# Patient Record
Sex: Male | Born: 1945 | Race: White | Hispanic: No | Marital: Married | Smoking: Former smoker
Health system: Southern US, Community
[De-identification: ages and names within clinical notes are randomized; demographics above are authoritative.]

## PROBLEM LIST (undated history)

## (undated) DIAGNOSIS — I10 Essential (primary) hypertension: Secondary | ICD-10-CM

## (undated) DIAGNOSIS — L570 Actinic keratosis: Secondary | ICD-10-CM

## (undated) DIAGNOSIS — K5790 Diverticulosis of intestine, part unspecified, without perforation or abscess without bleeding: Secondary | ICD-10-CM

## (undated) DIAGNOSIS — E119 Type 2 diabetes mellitus without complications: Secondary | ICD-10-CM

## (undated) DIAGNOSIS — K760 Fatty (change of) liver, not elsewhere classified: Secondary | ICD-10-CM

## (undated) DIAGNOSIS — K862 Cyst of pancreas: Secondary | ICD-10-CM

## (undated) DIAGNOSIS — I700 Atherosclerosis of aorta: Secondary | ICD-10-CM

## (undated) DIAGNOSIS — K8020 Calculus of gallbladder without cholecystitis without obstruction: Secondary | ICD-10-CM

## (undated) HISTORY — PX: NECK SURGERY: SHX720

## (undated) HISTORY — DX: Type 2 diabetes mellitus without complications: E11.9

## (undated) HISTORY — DX: Atherosclerosis of aorta: I70.0

## (undated) HISTORY — DX: Actinic keratosis: L57.0

## (undated) HISTORY — DX: Diverticulosis of intestine, part unspecified, without perforation or abscess without bleeding: K57.90

## (undated) HISTORY — DX: Fatty (change of) liver, not elsewhere classified: K76.0

## (undated) HISTORY — DX: Cyst of pancreas: K86.2

## (undated) HISTORY — DX: Calculus of gallbladder without cholecystitis without obstruction: K80.20

## (undated) HISTORY — PX: APPENDECTOMY: SHX54

## (undated) HISTORY — DX: Essential (primary) hypertension: I10

---

## 2005-10-04 ENCOUNTER — Ambulatory Visit: Payer: Self-pay | Admitting: Chiropractic Medicine

## 2005-10-08 ENCOUNTER — Ambulatory Visit: Payer: Self-pay | Admitting: Internal Medicine

## 2006-03-17 ENCOUNTER — Observation Stay (HOSPITAL_COMMUNITY): Admission: RE | Admit: 2006-03-17 | Discharge: 2006-03-18 | Payer: Self-pay | Admitting: Neurological Surgery

## 2006-07-20 ENCOUNTER — Inpatient Hospital Stay (HOSPITAL_COMMUNITY): Admission: EM | Admit: 2006-07-20 | Discharge: 2006-07-26 | Payer: Self-pay | Admitting: Emergency Medicine

## 2006-07-20 DIAGNOSIS — M8610 Other acute osteomyelitis, unspecified site: Secondary | ICD-10-CM | POA: Insufficient documentation

## 2006-07-22 ENCOUNTER — Encounter (INDEPENDENT_AMBULATORY_CARE_PROVIDER_SITE_OTHER): Payer: Self-pay | Admitting: Specialist

## 2006-07-23 ENCOUNTER — Ambulatory Visit: Payer: Self-pay | Admitting: Infectious Diseases

## 2006-08-13 ENCOUNTER — Encounter (INDEPENDENT_AMBULATORY_CARE_PROVIDER_SITE_OTHER): Payer: Self-pay | Admitting: Infectious Diseases

## 2006-08-18 ENCOUNTER — Encounter (INDEPENDENT_AMBULATORY_CARE_PROVIDER_SITE_OTHER): Payer: Self-pay | Admitting: Infectious Diseases

## 2006-08-21 ENCOUNTER — Ambulatory Visit: Payer: Self-pay | Admitting: Infectious Diseases

## 2006-08-22 DIAGNOSIS — I119 Hypertensive heart disease without heart failure: Secondary | ICD-10-CM | POA: Insufficient documentation

## 2006-08-25 ENCOUNTER — Encounter: Payer: Self-pay | Admitting: Infectious Diseases

## 2006-08-25 ENCOUNTER — Encounter (INDEPENDENT_AMBULATORY_CARE_PROVIDER_SITE_OTHER): Payer: Self-pay | Admitting: Infectious Diseases

## 2006-09-12 ENCOUNTER — Encounter: Payer: Self-pay | Admitting: Infectious Diseases

## 2006-09-12 ENCOUNTER — Encounter (INDEPENDENT_AMBULATORY_CARE_PROVIDER_SITE_OTHER): Payer: Self-pay | Admitting: Infectious Diseases

## 2006-10-08 ENCOUNTER — Ambulatory Visit: Payer: Self-pay | Admitting: Infectious Diseases

## 2006-10-08 LAB — CONVERTED CEMR LAB
Albumin: 4.4 g/dL (ref 3.5–5.2)
BUN: 19 mg/dL (ref 6–23)
CO2: 24 meq/L (ref 19–32)
CRP: 0.5 mg/dL (ref <0.6)
Calcium: 9.4 mg/dL (ref 8.4–10.5)
Eosinophils Relative: 3 % (ref 0–5)
Glucose, Bld: 127 mg/dL — ABNORMAL HIGH (ref 70–99)
Hemoglobin: 16.6 g/dL (ref 13.0–17.0)
Lymphocytes Relative: 36 % (ref 12–46)
Potassium: 4.4 meq/L (ref 3.5–5.3)
RBC: 6.08 M/uL — ABNORMAL HIGH (ref 4.22–5.81)
RDW: 13.8 % (ref 11.5–14.0)
Sodium: 138 meq/L (ref 135–145)
Total Bilirubin: 0.6 mg/dL (ref 0.3–1.2)

## 2006-10-16 ENCOUNTER — Telehealth: Payer: Self-pay | Admitting: Infectious Diseases

## 2007-03-24 ENCOUNTER — Telehealth: Payer: Self-pay | Admitting: Infectious Diseases

## 2007-03-25 ENCOUNTER — Ambulatory Visit: Payer: Self-pay | Admitting: Infectious Diseases

## 2007-03-25 LAB — CONVERTED CEMR LAB
CRP: 0.3 mg/dL (ref <0.6)
HCT: 49 % (ref 39.0–52.0)
MCHC: 32.9 g/dL (ref 30.0–36.0)
MCV: 82.9 fL (ref 78.0–100.0)
RBC: 5.91 M/uL — ABNORMAL HIGH (ref 4.22–5.81)
RDW: 13.8 % (ref 11.5–14.0)

## 2007-04-02 ENCOUNTER — Telehealth: Payer: Self-pay | Admitting: Infectious Diseases

## 2010-11-16 NOTE — Op Note (Signed)
[name], [name]                  ACCOUNT NO.:  [id number]   MEDICAL RECORD NO.:  [id number]          PATIENT TYPE:  INP   LOCATION:  3004                         FACILITY:  [facility]   PHYSICIAN:  Stefani Dama, M.D.  DATE OF BIRTH:  [birth year]   DATE OF PROCEDURE:  07/22/2006  DATE OF DISCHARGE:                               OPERATIVE REPORT   PREOPERATIVE DIAGNOSES:  1. Loosening of hardware, anterior cervical arthrodesis, C5-6 and C6-7      performed September 2007.  2. Prevertebral swelling, rule out infection.   POSTOPERATIVE DIAGNOSES:  1. Loosening of hardware, anterior cervical arthrodesis, C5-6 and C6-7      performed September 2007.  2. Prevertebral swelling, rule out infection.   PROCEDURE:  Exploration of anterior cervical arthrodesis C5-6 and C6-7,  removal of loose hardware.   SURGEON:  Stefani Dama, M.D.   FIRST ASSISTANT:  Dr. Coletta Memos.   ANESTHESIA:  General endotracheal.   INDICATIONS:  [name] is a 65 year old white male who had  anterior cervical decompression arthrodesis at C5-6 and C6-7.  He  initially seemed to be doing well but had persistent problems with  swallowing, difficulty with his neck in terms of pain and numbness and  tingling in his arms.  The spasms in the neck and swallowing  difficulties got worse to the point where the patient could not handle  any food this past weekend.  He was seen emergency room and readmitted  when an x-ray of his neck demonstrated significant prevertebral swelling  in addition to loosening of the lower screw and one of the holes at C7.  Further workup demonstrated he had diffuse prevertebral swelling up to  C3.  Because of concerns of potential for infection.  He is taken back  the operating room at this time.   PROCEDURE:  The patient was brought to the operating room, placed on  table supine position.  After smooth induction of general endotracheal  anesthesia.  He was placed in 5 pounds of  halter traction.  The neck was  prepped with alcohol and DuraPrep and draped in sterile fashion and the  previously made transverse incision was reopened.  Dissection was  carried down through the platysma and then with blunt dissection the  prevertebral region was explored.  The tissue was noted be severely  edematous and very indurated in this area.  The dissection proceeded  very slowly until the prevertebral space was reached.  Care was taken to  maintain the lateral trajectory so that the approach was lateral to the  esophagus.  Much of the dissection appeared in the same plane as the  initial approach, tissues being so edematous made dissecting and  retracting them very difficult particularly due to the induration and  nonetheless the lateral margin of the plate was identified and further  dissection identified it as the inferior aspect of the plate at C7  level.  The screw that time loosened itself was on the right side.  Further dissection to the soft tissue and identified the screw and  dissection was  taken up over the screw head.  The screw was then easily  removed and it was noted that the contents of hold had yielded a small  amount of pus.  This pus was obtained for culture both aerobic and  anaerobic and a stat Gram stain was also obtained.  Once the screw was  removed, the rest of hardware was inspected and was found be quite fixed  and adherent.  In fact the C7 screw on the opposite side appeared to be  well grown into the bone and was in good position.  The area of the disk  space was palpated about and no pus was then located in this region.  The pus that was obtained was local to the screw itself.  The wound was  then copiously irrigated and it was felt that simply removing the screw  was in the best interest of the patient but the screw would not be  replaced as the bone hole seemed to be somewhat enlarged.  With this  then a small drain was placed.  This was a medium  Hemovac drain brought  out through a separate stab incision.  The wound was then closed using 3-  0 Vicryl inverted interrupted fashion in the platysma.  3-0 Vicryl was  used subcuticularly and a nylon was placed around drain to secure it.  Dry sterile dressing was applied to the patient's neck.  He was then  returned to recovery room in stable condition.  The Gram stain result  available after completion of the procedure yielded many  polymorphonuclear cells but no organisms seen.      Stefani Dama, M.D.  Electronically Signed     HJE/MEDQ  D:  07/22/2006  T:  07/23/2006  Job:  [id number]

## 2010-11-16 NOTE — Discharge Summary (Signed)
Joshua Lane, Joshua Lane                  ACCOUNT NO.:  [id number]   MEDICAL RECORD NO.:  [id number]          PATIENT TYPE:  INP   LOCATION:  3004                         FACILITY:  [facility]   PHYSICIAN:  Stefani Dama, M.D.  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   DISCHARGE AND FINAL DIAGNOSES:  1. Prevertebral infection, status post arthrodesis, C5-6, C6-7,      September 2007.  2. Dysphagia.   CONDITION ON DISCHARGE:  Improving.   HOSPITAL COURSE:  Joshua Lane is a 65 year old individual who  underwent anterior decompression arthrodesis at the C5-6 and C6-7  levels.  He seemed to do well initially postoperatively; however, he  complained of some persistent difficulty with dysphagia.  He was seen in  the office and at the usual intervals of 2 weeks and then 6 weeks later  and at the 6-week interval, I advised evaluation by Ear, Nose and  Throat.  Initial postoperative film performed 3 weeks out demonstrated a  good placement of the hardware and modest amount of prevertebral  swelling, not unusual for this postoperative course.  The patient  developed increasing problems with swallowing and presented to the  emergency room on July 20, 2006.  The patient was found on x-ray to  have lost fixation of the lower C7 screw on the right side and there was  noted be substantial prevertebral soft tissue swelling.  A CT scan  confirmed the prevertebral soft tissue swelling and appeared by plain  radiographic criteria that the patient had an arthrodesis and because of  the concerns, he was admitted to the hospital and on the 22nd, he was  taken to the operating room for exploration of the wound and removal of  the hardware and debridement of the wound.  There was  some concern that  there may be a prevertebral abscess.  At the time of surgery,  significantly indurated and edematous tissue was found in the  prevertebral site.  Only a very tiny area of pus immediately around  screw was identified; this was sent for culture and on the 4th  postoperative day, returned as group B strep.  The patient had been on  broad-spectrum coverage with vancomycin and Zosyn 4.5 g every 8 hours.  When the cultures were returned as they were, the patient was started on  Rocephin in addition to rifampin and he is sent home on these  medications.  A PICC line has been placed in the right arm.  The patient  with has noted substantial improvement in his ability to swallow and he  is feeling much less pain and discomfort.   FOLLOWUP:  He will be seen in the office in approximately 2-3 weeks'  time for further followup.   DISCHARGE MEDICATIONS:  He was also given a prescription for Percocet,  #40 without refills, Valium 5 mg, #30 without  refills.     Stefani Dama, M.D.  Electronically Signed    HJE/MEDQ  D:  07/25/2006  T:  07/26/2006  Job:  [id number]

## 2010-11-16 NOTE — Op Note (Signed)
NAMEKIERNAN, ATKERSON                  ACCOUNT NO.:  [id number]   MEDICAL RECORD NO.:  [id number]          PATIENT TYPE:  INP   LOCATION:  2899                         FACILITY:  [facility]   PHYSICIAN:  Stefani Dama, M.D.  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   POSTOPERATIVE DIAGNOSIS:  Herniated nucleus pulposus and spondylosis C5-6,  C6-7 with myelopathy and radiculopathy.   PROCEDURES:  Anterior cervical decompression C5-6, C6-7, arthrodesis with  local autograft and allograft PEEK spacer implantation C5-6, C6-7 and  anterior plate fixation with Alphatec plate C5 to C7.   SURGEON:  Dr. Barnett Abu.   FIRST ASSISTANT:  Dr. Orbie Hurst.   ANESTHESIA:  General endotracheal.   INDICATIONS:  Masahiro Okubo is a 65 year old individual whose had significant  neck, shoulder, and arm problems with dysesthesias in the upper extremities.  He was found to have severe spondylosis at the C5-C6 level with some  effacement and compression of the ventral aspect of the spinal cord. There  is bi-foraminal stenosis that is quite severe. He has been advised regarding  surgical decompression arthrodesis from C5 to C7.   DESCRIPTION OF PROCEDURE:  The patient was brought to the operating room  supine on the stretcher and after the smooth induction of general  endotracheal anesthesia, he was placed in 5 pounds of halter traction.  The  neck was prepped with DuraPrep and draped in a sterile fashion.  A  transverse incision was created in the lower portion of the neck on the left  side and this was carried down through the platysma.  The plane between the  sternocleidomastoid and the strap muscles was dissected bluntly until the  first prevertebral space was reached. The first identifiable disk space  was  noted to be that of C4-C5 on a localizing radiograph. The prevertebral  tissues in front of C5-6 and C6-7 were then cleaned and the longus coli  muscle was stripped off of either side to allow placement of a Caspar  retractor. With this then the ventral osteophytes that were rather large at  the C5-C6 level were removed using a Leksell rongeur.  The disk space was  entered with a 15 blade and a combination of Kerrison rongeurs was used to  remove some severely desiccated disk material.  The disk space itself was  noted to be very closed and very tight. It did in fact require some drilling  on the ventral aspect to open up the space to allow placement of a self-  retaining disk spreader.  This allowed for further debridement of  degenerated disk. The overgrown endplates and removal of some osteophytic  material to allow placement of the self-retaining disk spreader lower into  the wound with a high-speed drill was then used a drill off osteophytes,  particularly from the uncinate process first on the right side then on the  left side.  With these being removed, the large osteophyte from the inferior  margin of the body of C5 could be cleared and when this was removed the  posterior longitudinal ligament could be taken up to allow decompression  from the right side first across to the left side. With the osteophytic  spurs being removed, the endplates were then shaped to be smooth and  parallel and the interspace was then sized for a 6 mm medium size PEEK  spacer.  This was filled with a combination of the patient's own bone mixed  with some demineralized bone matrix putty from Alphatec. Once this space was  fitted with the spacer, attention was turned to C6-C7.  Here the ventral  aspect of the vertebral body had much smaller bone spurs.  These were easily  removed with a Beyer rongeur.  The disk was then opened with a 15 blade and  a combination of curettes and rongeurs was used to  remove some moderately  degenerated disk material. Uncinate spurs were noted to be somewhat smaller  in this level.  These were drilled down with a 2.3 mm dissecting tool. There  was a modest sized spur from the posterior border of the body of C6 and this  was drilled down. In the end, the common dural tube and both lateral  recesses were decompressed. The nerve roots were easily sounded out to their  lateral extent. Hemostasis from the epidural space was obtained with some  small pledgets of Gelfoam soaked in thrombin, which were later irrigated  away. The interspace was again sized and it was felt that a 6 mm medium-  sized PEEK spacer would fit nicely into the interspace.  This was filled  with local autograft and allograft that had had been at C5-6. The spacer was  placed into the ventral aspect and tamped in until flush. Traction was then  removed. The prevertebral space was then sized for the appropriate size  plate and it was felt that a 37 mm trestle  style plate would fit nicely.  This was fitted and locked into position with variable screws in C5 and C7  measuring 14 mm in length. Fixed angle screws were placed at C6.  Once the  hardware was placed, attempts at obtaining lateral radiography were  performed on two occasions, but despite good traction in the upper  extremities only the superior portion of the body of C5 could be visualized.  With this, the area was inspected carefully visually. Hemostasis was  obtained meticulously. The plate appeared to be locked in a good position  and with this then the retractors were removed.  With hemostasis being  secured, the platysma was closed with 3-0 Vicryl in interrupted fashion and  3-0 Vicryl was used in the subcuticular tissues.  Dermabond was placed on  the skin.  The patient tolerated the procedure well and was returned to the  recovery room in stable condition.      Stefani Dama, M.D. Electronically Signed     HJE/MEDQ   D:  03/17/2006  T:  03/18/2006  Job:  [id number]

## 2010-11-16 NOTE — H&P (Signed)
NAMERANDOLPH, SHELLHAMMER                  ACCOUNT NO.:  [id number]   MEDICAL RECORD NO.:  [id number]          PATIENT TYPE:  INP   LOCATION:  3004                         FACILITY:  [facility]   PHYSICIAN:  Hilda Lias, M.D.   DATE OF BIRTH:  [birth year]   DATE OF ADMISSION:  07/20/2006  DATE OF DISCHARGE:  03/07/2006                              HISTORY & PHYSICAL   HISTORY:  Mr. Scoggins is gentleman who came to the emergency room with his  wife because of muscle spasms in __________ area plus difficulty to  swallow.  Mr. Dusek had surgery at the level 5-6 by Dr. Danielle Dess, September  2007.  Since then, he has been having some difficulty swallowing and  according to him, he had developed a __________ in the neck. He is  scheduled to be seen by the ENT in February 2008.  For the past few days  it is getting more difficult to swallow.  He has complained of muscle  spasms __________.  Because of pain and because of the increase in the  difficulty to swallow he was brought in by his wife and we are admitting  him to the hospital.   PAST MEDICAL HISTORY:  Cervical fusion at the level 5-6, 6-7.   FAMILY HISTORY:  Unremarkable.   REVIEW OF SYSTEMS:  Positive for neck pain and difficulty swallowing.   PHYSICAL EXAMINATION:  HEENT:  Normal.  NECK:  He has a well-healed scar in the neck.  I do not see any muscle  swelling in the left neck.  He has __________ of the neck to the point  that any movement __________  LUNGS:  Clear.  HEART:  Sounds are normal.  ABDOMEN:  Normal.  EXTREMITIES:  Normal tone.  NEUROLOGIC:  Mental status normal, cranial nerves normal.  Strength:  __________ weakness although __________ because of the neck pain.  Extension is normal.  __________.   CLINICAL IMPRESSION:  1. Status post cervical fusion at the level of 4-5, 6-7.  2. Difficulty swallowing.  3. Muscle spasms.   The patient is being admitted for __________. Spine x-ray and neck x-  ray.  Consult ENT for symptoms  of __________.           ______________________________  Hilda Lias, M.D.     EB/MEDQ  D:  07/20/2006  T:  07/20/2006  Job:  [id number]

## 2014-05-27 ENCOUNTER — Emergency Department: Payer: Self-pay | Admitting: Emergency Medicine

## 2014-05-27 LAB — BASIC METABOLIC PANEL
Anion Gap: 7 (ref 7–16)
BUN: 37 mg/dL — ABNORMAL HIGH (ref 7–18)
CALCIUM: 9 mg/dL (ref 8.5–10.1)
CO2: 26 mmol/L (ref 21–32)
Chloride: 104 mmol/L (ref 98–107)
Creatinine: 1.2 mg/dL (ref 0.60–1.30)
Glucose: 190 mg/dL — ABNORMAL HIGH (ref 65–99)
Osmolality: 288 (ref 275–301)
Potassium: 3.9 mmol/L (ref 3.5–5.1)
Sodium: 137 mmol/L (ref 136–145)

## 2014-05-27 LAB — CBC
HCT: 46 % (ref 40.0–52.0)
HGB: 15.2 g/dL (ref 13.0–18.0)
MCH: 29 pg (ref 26.0–34.0)
MCHC: 32.9 g/dL (ref 32.0–36.0)
MCV: 88 fL (ref 80–100)
Platelet: 284 10*3/uL (ref 150–440)
RBC: 5.23 10*6/uL (ref 4.40–5.90)
RDW: 13.3 % (ref 11.5–14.5)
WBC: 15.2 10*3/uL — ABNORMAL HIGH (ref 3.8–10.6)

## 2014-05-27 LAB — PRO B NATRIURETIC PEPTIDE: B-TYPE NATIURETIC PEPTID: 29 pg/mL (ref 0–125)

## 2014-05-27 LAB — TROPONIN I

## 2014-06-22 ENCOUNTER — Ambulatory Visit: Payer: Self-pay | Admitting: Cardiovascular Disease

## 2014-06-22 ENCOUNTER — Encounter: Payer: Self-pay | Admitting: *Deleted

## 2014-06-29 ENCOUNTER — Telehealth: Payer: Self-pay | Admitting: *Deleted

## 2014-06-29 NOTE — Telephone Encounter (Signed)
Patients wife called because she received a "no show" letter for an appointment she thought was canceled by the PCP Dr. Hall Busing whom arranged appointment.   Patients wife does not want to be billed for an appointment the patient requested be cancelled.   She was given contact information for billing and asked to call back to this office next week to speak with management if not resolved with billing over the phone.

## 2015-05-15 ENCOUNTER — Ambulatory Visit (INDEPENDENT_AMBULATORY_CARE_PROVIDER_SITE_OTHER): Payer: 59 | Admitting: Cardiovascular Disease

## 2015-05-15 ENCOUNTER — Encounter: Payer: Self-pay | Admitting: Cardiovascular Disease

## 2015-05-15 VITALS — BP 124/80 | HR 71 | Ht 68.5 in | Wt 208.5 lb

## 2015-05-15 DIAGNOSIS — E1159 Type 2 diabetes mellitus with other circulatory complications: Secondary | ICD-10-CM | POA: Insufficient documentation

## 2015-05-15 DIAGNOSIS — E1100 Type 2 diabetes mellitus with hyperosmolarity without nonketotic hyperglycemic-hyperosmolar coma (NKHHC): Secondary | ICD-10-CM

## 2015-05-15 DIAGNOSIS — I10 Essential (primary) hypertension: Secondary | ICD-10-CM

## 2015-05-15 DIAGNOSIS — R079 Chest pain, unspecified: Secondary | ICD-10-CM

## 2015-05-15 DIAGNOSIS — I159 Secondary hypertension, unspecified: Secondary | ICD-10-CM

## 2015-05-15 DIAGNOSIS — R9431 Abnormal electrocardiogram [ECG] [EKG]: Secondary | ICD-10-CM

## 2015-05-15 DIAGNOSIS — R5383 Other fatigue: Secondary | ICD-10-CM

## 2015-05-15 DIAGNOSIS — I209 Angina pectoris, unspecified: Secondary | ICD-10-CM

## 2015-05-15 HISTORY — DX: Type 2 diabetes mellitus with hyperosmolarity without nonketotic hyperglycemic-hyperosmolar coma (NKHHC): E11.00

## 2015-05-15 NOTE — Patient Instructions (Addendum)
You are doing well. No medication changes were made.  We will schedule a treadmill myoview  Please call us if you have new issues that need to be addressed before your next appt.  Your physician wants you to follow-up in: 12 months.  You will receive a reminder letter in the mail two months in advance. If you don't receive a letter, please call our office to schedule the follow-up appointment.  [place]  Your caregiver has ordered a Stress Test with nuclear imaging. The purpose of this test is to evaluate the blood supply to your heart muscle. This procedure is referred to as a "Non-Invasive Stress Test." This is because other than having an IV started in your vein, nothing is inserted or "invades" your body. Cardiac stress tests are done to find areas of poor blood flow to the heart by determining the extent of coronary artery disease (CAD). Some patients exercise on a treadmill, which naturally increases the blood flow to your heart, while others who are  unable to walk on a treadmill due to physical limitations have a pharmacologic/chemical stress agent called Lexiscan . This medicine will mimic walking on a treadmill by temporarily increasing your coronary blood flow.   Please note: these test may take anywhere between 2-4 hours to complete  PLEASE REPORT TO [facility] AT THE FIRST DESK WILL DIRECT YOU WHERE TO GO  Date of Procedure:___Wednesday, November 16_____  Arrival Time for Procedure:___7:15 am_____________  Instructions regarding medication:   __X__ : Hold diabetes medication morning of procedure: METFORMIN  __X__:  Hold AMLODIPINE the morning of your procedure  PLEASE NOTIFY THE OFFICE AT LEAST 24 HOURS IN ADVANCE IF YOU ARE UNABLE TO KEEP YOUR APPOINTMENT.  [phone] AND  PLEASE NOTIFY NUCLEAR MEDICINE AT [facility] AT LEAST 24 HOURS IN ADVANCE IF YOU ARE UNABLE TO KEEP YOUR APPOINTMENT. [phone]  How to prepare for your Myoview  test:   Do not eat or drink after midnight  No caffeine for 24 hours prior to test  No smoking 24 hours prior to test.  Your medication may be taken with water.  If your doctor stopped a medication because of this test, do not take that medication.  Ladies, please do not wear dresses.  Skirts or pants are appropriate. Please wear a short sleeve shirt.  No perfume, cologne or lotion.  Wear comfortable walking shoes. No heels!         Cardiac Nuclear Scanning A cardiac nuclear scan is used to check your heart for problems, such as the following:  A portion of the heart is not getting enough blood.  Part of the heart muscle has died, which happens with a heart attack.  The heart wall is not working normally.  In this test, a radioactive dye (tracer) is injected into your bloodstream. After the tracer has traveled to your heart, a scanning device is used to measure how much of the tracer is absorbed by or distributed to various areas of your heart. LET [facility] CARE PROVIDER KNOW ABOUT:  Any allergies you have.  All medicines you are taking, including vitamins, herbs, eye drops, creams, and over-the-counter medicines.  Previous problems you or members of your family have had with the use of anesthetics.  Any blood disorders you have.  Previous surgeries you have had.  Medical conditions you have.  RISKS AND COMPLICATIONS Generally, this is a safe procedure. However, as with any procedure, problems can occur. Possible problems include:  Serious chest pain.  Rapid heartbeat.  Sensation of warmth in your chest. This usually passes quickly. BEFORE THE PROCEDURE Ask your health care provider about changing or stopping your regular medicines. PROCEDURE This procedure is usually done at a hospital and takes 2-4 hours.  An IV tube is inserted into one of your veins.  Your health care provider will inject a small amount of radioactive tracer through the tube.  You  will then wait for 20-40 minutes while the tracer travels through your bloodstream.  You will lie down on an exam table so images of your heart can be taken. Images will be taken for about 15-20 minutes.  You will exercise on a treadmill or stationary bike. While you exercise, your heart activity will be monitored with an electrocardiogram (ECG), and your blood pressure will be checked.  If you are unable to exercise, you may be given a medicine to make your heart beat faster.  When blood flow to your heart has peaked, tracer will again be injected through the IV tube.  After 20-40 minutes, you will get back on the exam table and have more images taken of your heart.  When the procedure is over, your IV tube will be removed. AFTER THE PROCEDURE  You will likely be able to leave shortly after the test. Unless your health care provider tells you otherwise, you may return to your normal schedule, including diet, activities, and medicines.  Make sure you find out how and when you will get your test results.   This information is not intended to replace advice given to you by your health care provider. Make sure you discuss any questions you have with your health care provider.   Document Released: 07/12/2004 Document Revised: 06/22/2013 Document Reviewed: 05/26/2013 Elsevier Interactive Patient Education [employer].

## 2015-05-15 NOTE — Assessment & Plan Note (Signed)
Numerous risk factors for ischemia Joshua Lane smoking history, Joshua Lane history of diabetes,  abnormality seen on EKG Symptoms concerning for ischemia including general malaise, worse with exertion, some shortness of breath, chest tightness on heavy exertion Stress test ordered to rule out ischemia Unclear. Be able to treadmill given his cervical spine surgery. Suspect he will need  pharmacologic Myoview

## 2015-05-15 NOTE — Assessment & Plan Note (Signed)
Stress test ordered for malaise on exertion, mild shortness of breath, chest tightness on heavy exertion Pharmacologic Myoview ordered

## 2015-05-15 NOTE — Assessment & Plan Note (Signed)
Managed by primary care We have encouraged continued exercise, careful diet management in an effort to lose weight. Hemoglobin A1c in the 7.8 range

## 2015-05-15 NOTE — Progress Notes (Signed)
Patient ID: Joshua Lane, male    DOB: [birth year], 69 y.o.   MRN: [id number]  HPI Comments: Joshua Lane is a pleasant 70 year old gentleman with history of hypertension, smoking, type 2 diabetes, chronic neck pain, status post surgery of the cervical spine, upper extremity neuropathy, presenting with malaise, decreased energy, some shortness of breath, chest tightness on exertion.  He reports that over the past month or so he has had worsening symptoms. He tries to stay active, continues to work, drives a truck and trailer Is not sleeping well secondary to his neck, difficult getting comfortable.  Somewhat better with his muscle relaxer pill No regular exercise program.  Use to do lots of activities around the house on days off, now does not have energy to do anything, sits around Shortness of breath, tightness of the chest, with heavy activities  [place] history of smoking, now uses premade tobacco pouches review of lab work shows total cholesterol 167, LDL 92, HDL 44, hemoglobin A1c 7.8  EKG on today's visit shows normal sinus rhythm with sinus arrhythmia, nonspecific ST abnormality, APCs      Allergies  Allergen Reactions  . Penicillins     REACTION: ?     Medication List       This list is accurate as of: 05/15/15 10:24 AM.  Always use your most recent med list.               amLODipine 10 MG tablet  Commonly known as:  NORVASC  Take 10 mg by mouth daily.     losartan-hydrochlorothiazide 100-25 MG tablet  Commonly known as:  HYZAAR  Take 1 tablet by mouth daily.     metFORMIN 500 MG tablet  Commonly known as:  GLUCOPHAGE  Take 1,000 mg by mouth 2 (two) times daily.     tiZANidine 4 MG tablet  Commonly known as:  ZANAFLEX  Take 4 mg by mouth at bedtime.        Past Medical History  Diagnosis Date  . Hypertension   . Diabetes mellitus without complication [facility])     Past Surgical History  Procedure Laterality Date  . Neck surgery      Social History  reports that he quit smoking about 8 years ago. His smoking use included Cigarettes. He has a 55 pack-year smoking history. His smokeless tobacco use includes Snuff. He reports that he drinks alcohol. He reports that he does not use illicit drugs.  Family History Family history is unknown by patient.    Review of Systems  Constitutional: Positive for fatigue.  Respiratory: Positive for chest tightness and shortness of breath.   Cardiovascular: Negative.   Gastrointestinal: Negative.   Musculoskeletal: Negative.   Neurological: Negative.   Hematological: Negative.   Psychiatric/Behavioral: Negative.   All other systems reviewed and are negative.   BP 124/80 mmHg  Pulse 71  Ht 5' 8.5" (1.74 m)  Wt 208 lb 8 oz (94.575 kg)  BMI 31.24 kg/m2  Physical Exam  Constitutional: He is oriented to person, place, and time. He appears well-developed and well-nourished.  HENT:  Head: Normocephalic.  Nose: Nose normal.  Mouth/Throat: Oropharynx is clear and moist.  Eyes: Conjunctivae are normal. Pupils are equal, round, and reactive to light.  Neck: Normal range of motion. Neck supple. No JVD present.  Cardiovascular: Normal rate, regular rhythm, normal heart sounds and intact distal pulses.  Exam reveals no gallop and no friction rub.   No murmur heard. Pulmonary/Chest: Effort normal. No respiratory  distress. He has decreased breath sounds. He has no wheezes. He has no rales. He exhibits no tenderness.  Abdominal: Soft. Bowel sounds are normal. He exhibits no distension. There is no tenderness.  Musculoskeletal: Normal range of motion. He exhibits no edema or tenderness.  Lymphadenopathy:    He has no cervical adenopathy.  Neurological: He is alert and oriented to person, place, and time. Coordination normal.  Skin: Skin is warm and dry. No rash noted. No erythema.  Psychiatric: He has a normal mood and affect. His behavior is normal. Judgment and thought content normal.

## 2015-05-15 NOTE — Assessment & Plan Note (Signed)
Blood pressure is well controlled on today's visit. No changes made to the medications. 

## 2015-05-16 ENCOUNTER — Telehealth: Payer: Self-pay

## 2015-05-16 NOTE — Telephone Encounter (Signed)
Spoke w/ pt.  Reviewed instructions for ETT [facility] tomorrow am.  He is appreciative of the call and will keep his appt.

## 2015-05-17 ENCOUNTER — Ambulatory Visit
Admission: RE | Admit: 2015-05-17 | Discharge: 2015-05-17 | Disposition: A | Discharge status: Home / Self Care | Payer: 59 | Source: Ambulatory Visit | Attending: Cardiovascular Disease | Admitting: Cardiovascular Disease

## 2015-05-17 DIAGNOSIS — R5383 Other fatigue: Secondary | ICD-10-CM | POA: Diagnosis present

## 2015-05-17 DIAGNOSIS — E1100 Type 2 diabetes mellitus with hyperosmolarity without nonketotic hyperglycemic-hyperosmolar coma (NKHHC): Secondary | ICD-10-CM | POA: Diagnosis not present

## 2015-05-17 DIAGNOSIS — R079 Chest pain, unspecified: Secondary | ICD-10-CM | POA: Insufficient documentation

## 2015-05-17 DIAGNOSIS — I159 Secondary hypertension, unspecified: Secondary | ICD-10-CM | POA: Diagnosis not present

## 2015-05-17 DIAGNOSIS — I209 Angina pectoris, unspecified: Secondary | ICD-10-CM | POA: Insufficient documentation

## 2015-05-17 DIAGNOSIS — R9431 Abnormal electrocardiogram [ECG] [EKG]: Secondary | ICD-10-CM | POA: Diagnosis not present

## 2015-05-17 DIAGNOSIS — I200 Unstable angina: Secondary | ICD-10-CM | POA: Diagnosis not present

## 2015-05-17 LAB — NM MYOCAR MULTI W/SPECT W/WALL MOTION / EF
CHL CUP NUCLEAR SDS: 0
CHL CUP NUCLEAR SSS: 0
CSEPHR: 94 %
CSEPPHR: 144 {beats}/min
Estimated workload: 6.4 METS
Exercise duration (min): 4 min
LV dias vol: 89 mL
LV sys vol: 19 mL
Rest HR: 63 {beats}/min
SRS: 2
TID: 0.94

## 2015-05-17 MED ORDER — TECHNETIUM TC 99M SESTAMIBI - CARDIOLITE
10.0000 | Freq: Once | INTRAVENOUS | Status: AC | PRN
  Administered 2015-05-17: 08:00:00 13.93 via INTRAVENOUS

## 2015-05-17 MED ORDER — TECHNETIUM TC 99M SESTAMIBI - CARDIOLITE
30.0000 | Freq: Once | INTRAVENOUS | Status: AC | PRN
  Administered 2015-05-17: 08:00:00 32.64 via INTRAVENOUS

## 2016-09-10 DIAGNOSIS — F17200 Nicotine dependence, unspecified, uncomplicated: Secondary | ICD-10-CM

## 2016-09-10 HISTORY — DX: Nicotine dependence, unspecified, uncomplicated: F17.200

## 2016-09-10 NOTE — Progress Notes (Signed)
Cardiology Office Note  Date:  09/11/2016   ID:  Joshua Lane, DOB 21-Dec-1945, MRN [id number]  PCP:  Albina Billet, MD   Chief Complaint  Patient presents with  . other    12 month follow up. Meds reviewed by the pt. verbally.  "doing well."     HPI:  Joshua Lane is a pleasant 71 year old gentleman with history of hypertension, smoking stopped 10 years ago, type 2 diabetes, chronic neck pain, status post surgery of the cervical spine, upper extremity neuropathy, presenting with malaise, decreased energy, some shortness of breath, chest tightness on exertion.  On his last clinic visit he reported having shortness of breath, chest tightness. 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 Stopped smoking, more than 10 years ago  Very active around the house  Joshua history of smoking, now uses premade tobacco pouches review of lab work shows total cholesterol 150, LDL 73, HDL 65, hemoglobin A1c 6.4  EKG personally reviewed by myself on today's visit shows normal sinus rhythm with rate 82 bpm,  no significant ST or T wave abn   PMH:   has a past medical history of Diabetes mellitus without complication ([place]) and Hypertension.  PSH:    Past Surgical History:  Procedure Laterality Date  . NECK SURGERY      Current Outpatient Prescriptions  Medication Sig Dispense Refill  . amLODipine (NORVASC) 10 MG tablet Take 10 mg by mouth daily.  4  . aspirin EC 81 MG tablet Take 81 mg by mouth daily.    Marland Kitchen glimepiride (AMARYL) 1 MG tablet Take 1 mg by mouth daily with breakfast.    . losartan-hydrochlorothiazide (HYZAAR) 100-25 MG tablet Take 1 tablet by mouth daily.  5  . metFORMIN (GLUCOPHAGE) 500 MG tablet Take 1,000 mg by mouth 2 (two)  times daily.  5  . omeprazole (PRILOSEC) 20 MG capsule Take 20 mg by mouth daily.     No current facility-administered medications for this visit.      Allergies:   Penicillins   Social History:  The patient  reports that he quit smoking about 10 years ago. His smoking use included Cigarettes. He has a 55.00 pack-year smoking history. His smokeless tobacco use includes Snuff. He reports that he drinks alcohol. He reports that he does not use drugs.   Family History:   Family history is unknown by patient.    Review of Systems: Review of Systems  Constitutional: Negative.   Respiratory: Negative.   Cardiovascular: Negative.   Gastrointestinal: Negative.   Musculoskeletal: Negative.   Neurological: Negative.   Psychiatric/Behavioral: Negative.   All other systems reviewed and are negative.    PHYSICAL EXAM: VS:  BP 112/80 (BP Location: Left Arm, Patient Position: Sitting, Cuff Size: Normal)   Pulse 82   Ht 5' 8.5" (1.74 m)   Wt 217 lb 8 oz (98.7 kg)   BMI 32.59 kg/m  , BMI Body mass index is 32.59 kg/m. GEN: Well nourished, well developed, in no acute distress, obese HEENT: normal  Neck: no JVD, carotid bruits, or masses Cardiac: RRR; no murmurs, rubs, or gallops,no edema  Respiratory:  clear to auscultation bilaterally, normal work of breathing GI: soft, nontender, nondistended, + BS MS: no deformity  or atrophy  Skin: warm and dry, no rash Neuro:  Strength and sensation are intact Psych: euthymic mood, full affect    Recent Labs: No results found for requested labs within last 8760 hours.    Lipid Panel No results found for: CHOL, HDL, LDLCALC, TRIG    Wt Readings from Last 3 Encounters:  09/11/16 217 lb 8 oz (98.7 kg)  05/15/15 208 lb 8 oz (94.6 kg)  03/25/07 219 lb 8 oz (99.6 kg)       ASSESSMENT AND PLAN:  Essential hypertension - Plan: EKG 12-Lead Blood pressure is well controlled on today's visit. No changes made to the medications.  Type 2  diabetes mellitus with hyperosmolarity without coma, without [place]-term current use of insulin ([place]) - Plan: EKG 12-Lead [place] discussion concerning diabetes, diet, weight loss Hemoglobin A1c has improved down to 6.4 down from 8.1 He is trying to watch his diet more closely  Chest pain, unspecified type - Plan: EKG 12-Lead [place] discussion concerning previous chest pain symptoms Denies having any recurrence of his symptoms though no regular exercise program Discussed previous stress test results with him showing no significant ischemia Cholesterol very reasonable considering he is not on a statin Numbers discussed with him in detail  Smoker - Plan: EKG 12-Lead Stopped smoking 10 years ago   Total encounter time more than 25 minutes  Greater than 50% was spent in counseling and coordination of care with the patient   Disposition:   F/U  12 months   Orders Placed This Encounter  Procedures  . EKG 12-Lead     Signed, Esmond Plants, M.D., Ph.D. 09/11/2016  [place], [place]

## 2016-09-11 ENCOUNTER — Encounter: Payer: Self-pay | Admitting: Cardiovascular Disease

## 2016-09-11 ENCOUNTER — Ambulatory Visit (INDEPENDENT_AMBULATORY_CARE_PROVIDER_SITE_OTHER): Payer: Medicare HMO | Admitting: Cardiovascular Disease

## 2016-09-11 VITALS — BP 112/80 | HR 82 | Ht 68.5 in | Wt 217.5 lb

## 2016-09-11 DIAGNOSIS — E1100 Type 2 diabetes mellitus with hyperosmolarity without nonketotic hyperglycemic-hyperosmolar coma (NKHHC): Secondary | ICD-10-CM

## 2016-09-11 DIAGNOSIS — I10 Essential (primary) hypertension: Secondary | ICD-10-CM

## 2016-09-11 DIAGNOSIS — F17200 Nicotine dependence, unspecified, uncomplicated: Secondary | ICD-10-CM

## 2016-09-11 DIAGNOSIS — R079 Chest pain, unspecified: Secondary | ICD-10-CM

## 2016-09-11 NOTE — Patient Instructions (Signed)

## 2018-10-22 ENCOUNTER — Telehealth: Payer: Self-pay

## 2018-10-22 NOTE — Telephone Encounter (Signed)
Spoke with patient to schedule overdue F/U with Dr. Rockey Situ. Informed patient that our office is currently scheduling telephone or video visits due to [place] 19 precautions. Pt declined to schedule at this time. He states he is doing well and would like to schedule his appointment for a time when he can come in to the office for his appointment. Recall placed for 01/30/2019. Pt informed.

## 2018-11-21 ENCOUNTER — Other Ambulatory Visit: Payer: Self-pay

## 2018-11-21 ENCOUNTER — Emergency Department
Admission: EM | Admit: 2018-11-21 | Discharge: 2018-11-21 | Disposition: A | Discharge status: Home / Self Care | Payer: Medicare HMO | Attending: Emergency Medicine | Admitting: Emergency Medicine

## 2018-11-21 ENCOUNTER — Encounter: Payer: Self-pay | Admitting: Physician Assistant

## 2018-11-21 DIAGNOSIS — L02211 Cutaneous abscess of abdominal wall: Secondary | ICD-10-CM | POA: Diagnosis not present

## 2018-11-21 DIAGNOSIS — E119 Type 2 diabetes mellitus without complications: Secondary | ICD-10-CM | POA: Insufficient documentation

## 2018-11-21 DIAGNOSIS — I10 Essential (primary) hypertension: Secondary | ICD-10-CM | POA: Diagnosis not present

## 2018-11-21 DIAGNOSIS — L03311 Cellulitis of abdominal wall: Secondary | ICD-10-CM | POA: Insufficient documentation

## 2018-11-21 DIAGNOSIS — Z7982 Long term (current) use of aspirin: Secondary | ICD-10-CM | POA: Insufficient documentation

## 2018-11-21 DIAGNOSIS — F17228 Nicotine dependence, chewing tobacco, with other nicotine-induced disorders: Secondary | ICD-10-CM | POA: Diagnosis not present

## 2018-11-21 DIAGNOSIS — Z7984 Long term (current) use of oral hypoglycemic drugs: Secondary | ICD-10-CM | POA: Diagnosis not present

## 2018-11-21 DIAGNOSIS — L0291 Cutaneous abscess, unspecified: Secondary | ICD-10-CM | POA: Diagnosis present

## 2018-11-21 MED ORDER — SULFAMETHOXAZOLE-TRIMETHOPRIM 800-160 MG PO TABS
1.0000 | ORAL_TABLET | Freq: Once | ORAL | Status: AC
  Administered 2018-11-21: 1 via ORAL
  Filled 2018-11-21: qty 1

## 2018-11-21 MED ORDER — LIDOCAINE HCL (PF) 1 % IJ SOLN
5.0000 mL | Freq: Once | INTRAMUSCULAR | Status: AC
Start: 2018-11-21 — End: 2018-11-21
  Administered 2018-11-21: 21:00:00 5 mL
  Filled 2018-11-21: qty 5

## 2018-11-21 MED ORDER — SULFAMETHOXAZOLE-TRIMETHOPRIM 800-160 MG PO TABS: 1.0000 | ORAL_TABLET | Freq: Two times a day (BID) | ORAL | 0 refills | Status: DC

## 2018-11-21 NOTE — Discharge Instructions (Addendum)
Your are being treated for cellulitis. Take the Bactrim in addition to the Cephalexin previously prescribed. Apply warm compresses over the dressing to promote healing. Follow-up with Dr. Hall Busing as planned.

## 2018-11-21 NOTE — ED Triage Notes (Signed)
Patient reports spider bite to abdomen a week ago that he is concerned is not healing properly.  Patient is currently on antibiotics.

## 2018-11-21 NOTE — ED Provider Notes (Signed)
[facility] Emergency Department Provider Note ____________________________________________  Time seen: 2006  I have reviewed the triage vital signs and the nursing notes.  HISTORY  Chief Complaint  Insect Bite  HPI Joshua Lane is a 73 y.o. male presents himself to the ED for evaluation of an abscess and cellulitis to the abdominal wall.  Patient describes what he thought was a spider bite to his abdominal wall above the umbilicus.  He describes what began as a small pustule to the area.  The area then began to fester and spread.  Patient has been expressing purulent drainage from the area over the last several days.  He was seen by his primary provider, and started on cephalexin that has been taking for the last 2 to 3 days.  He presented today after he felt like the area was not improving, and in fact felt like it was worsening.  He denies any interim fevers, chills, or sweats.  Patient denies any previous history of cutaneous abscesses.  Past Medical History:  Diagnosis Date  . Diabetes mellitus without complication ([place])   . Hypertension     Patient Active Problem List   Diagnosis Date Noted  . Smoker 09/10/2016  . Other fatigue 05/15/2015  . Pain in the chest 05/15/2015  . Type 2 diabetes mellitus with hyperosmolarity without coma, without [place]-term current use of insulin ([place]) 05/15/2015  . Anginal pain ([place]) 05/15/2015  . Secondary hypertension, unspecified 05/15/2015  . Abnormal EKG 05/15/2015  . Essential hypertension 05/15/2015  . 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 OSTEOMYELITIS, ACUTE, UNSPECIFIED SITE 07/20/2006    Past Surgical History:  Procedure Laterality Date  . NECK SURGERY      Prior to Admission medications   Medication Sig Start Date End Date Taking? Authorizing Provider  amLODipine (NORVASC) 10 MG tablet Take 10 mg by mouth daily. 05/11/15   [provider]  aspirin EC 81 MG tablet Take 81 mg by mouth  daily.    [provider]  glimepiride (AMARYL) 1 MG tablet Take 1 mg by mouth daily with breakfast.    [provider]  losartan-hydrochlorothiazide (HYZAAR) 100-25 MG tablet Take 1 tablet by mouth daily. 05/11/15   [provider]  metFORMIN (GLUCOPHAGE) 500 MG tablet Take 1,000 mg by mouth 2 (two) times daily. 05/05/15   [provider]  omeprazole (PRILOSEC) 20 MG capsule Take 20 mg by mouth daily.    [provider]  sulfamethoxazole-trimethoprim (BACTRIM DS) 800-160 MG tablet Take 1 tablet by mouth 2 (two) times daily. 11/21/18   Amarea Macdowell, Dannielle Karvonen, PA-C    Allergies Penicillins  Family History  Family history unknown: Yes    Social History Social History   Tobacco Use  . Smoking status: Former Smoker    Packs/day: 1.00    Years: 55.00    Pack years: 55.00    Types: Cigarettes    Last attempt to quit: 06/19/2006    Years since quitting: 12.4  . Smokeless tobacco: Current User    Types: Snuff  Substance Use Topics  . Alcohol use: Yes    Comment: Drinks 1 beer every night.   . Drug use: No    Review of Systems  Constitutional: Negative for fever. Eyes: Negative for visual changes. ENT: Negative for sore throat. Cardiovascular: Negative for chest pain. Respiratory: Negative for shortness of breath. Gastrointestinal: Negative for abdominal pain, vomiting and diarrhea. Genitourinary: Negative for dysuria. Musculoskeletal: Negative for back pain. Skin:  Negative for rash.  Abscess/cellulitis to the abdominal wall as above. Neurological: Negative for headaches, focal weakness or numbness. ____________________________________________  PHYSICAL EXAM:  VITAL SIGNS: ED Triage Vitals  Enc Vitals Group     BP 11/21/18 1917 (!) 118/91     Pulse Rate 11/21/18 1917 100     Resp 11/21/18 1917 18     Temp 11/21/18 1917 98.1 F (36.7 C)     Temp Source 11/21/18 1917 Oral     SpO2 11/21/18 1917 96 %     Weight 11/21/18 1911  217 lb (98.4 kg)     Height 11/21/18 1911 5\' 8"  (1.727 m)     Head Circumference --      Peak Flow --      Pain Score 11/21/18 1911 10     Pain Loc --      Pain Edu? --      Excl. in [place]? --     Constitutional: Alert and oriented. Well appearing and in no distress. Head: Normocephalic and atraumatic. Eyes: Conjunctivae are normal. Normal extraocular movements Cardiovascular: Normal rate, regular rhythm. Normal distal pulses. Respiratory: Normal respiratory effort. No wheezes/rales/rhonchi. Gastrointestinal: Soft and nontender. No distention. Abdominal wall with a local area of erythema, induration, surrounding a central punctum above the umbilicus.  Musculoskeletal: Nontender with normal range of motion in all extremities.  Neurologic:  Normal gait without ataxia. Normal speech and language. No gross focal neurologic deficits are appreciated. Skin:  Skin is warm, dry and intact. No rash noted. Psychiatric: Mood and affect are normal. Patient exhibits appropriate insight and judgment. ____________________________________________  PROCEDURES Bactrim DS i PO  .Marland KitchenIncision and Drainage Date/Time: 11/21/2018 9:23 PM Performed by: Melvenia Needles, PA-C Authorized by: Melvenia Needles, PA-C   Consent:    Consent obtained:  Verbal   Consent given by:  Patient   Risks discussed:  Bleeding, incomplete drainage and pain   Alternatives discussed:  Alternative treatment Location:    Type:  Abscess   Location:  Trunk   Trunk location:  Abdomen Pre-procedure details:    Skin preparation:  Betadine Anesthesia (see MAR for exact dosages):    Anesthesia method:  Local infiltration   Local anesthetic:  Lidocaine 1% w/o epi Procedure type:    Complexity:  Simple Procedure details:    Needle aspiration: no     Incision types:  Single straight   Incision depth:  Subcutaneous   Scalpel blade:  11   Wound management:  Probed and deloculated and irrigated with saline   Drainage:   Bloody and purulent   Drainage amount:  Scant   Packing materials:  1/4 in iodoform gauze   Amount 1/4" iodoform:  2 Post-procedure details:    Patient tolerance of procedure:  Tolerated well, no immediate complications  ____________________________________________  INITIAL IMPRESSION / ASSESSMENT AND PLAN / ED COURSE  Joshua Lane was evaluated in Emergency Department on 11/21/2018 for the symptoms described in the history of present illness. He was evaluated in the context of the global COVID-19 pandemic, which necessitated consideration that the patient might be at risk for infection with the SARS-CoV-2 virus that causes COVID-19. Institutional protocols and algorithms that pertain to the evaluation of patients at risk for COVID-19 are in a state of rapid change based on information released by regulatory bodies including the CDC and federal and state organizations. These policies and algorithms were followed during the patient's care in the ED.  Patient with ED evaluation of cellulitis  and poor response to cephalexin. An I&D procedure was performed with some moderate purulent drainage. He is discharged with a prescription for Bactrim to dose in addition to the Keflex for MRSA coverage. He will see Dr. Hall Busing on Wednesday for follow-up. ____________________________________________  FINAL CLINICAL IMPRESSION(S) / ED DIAGNOSES  Final diagnoses:  Cellulitis of abdominal wall      Katrina Daddona, Dannielle Karvonen, PA-C 11/21/18 2240    Lavonia Drafts, MD 11/21/18 2327

## 2018-11-21 NOTE — ED Notes (Signed)
Patient is currently on Cephalexin since Wednesday and has been compliant.

## 2019-04-07 ENCOUNTER — Other Ambulatory Visit: Payer: Self-pay

## 2019-04-07 ENCOUNTER — Ambulatory Visit
Admission: RE | Admit: 2019-04-07 | Discharge: 2019-04-07 | Disposition: A | Discharge status: Home / Self Care | Payer: Medicare HMO | Source: Ambulatory Visit | Attending: Internal Medicine | Admitting: Internal Medicine

## 2019-04-07 DIAGNOSIS — R9431 Abnormal electrocardiogram [ECG] [EKG]: Secondary | ICD-10-CM | POA: Insufficient documentation

## 2019-04-07 DIAGNOSIS — R009 Unspecified abnormalities of heart beat: Secondary | ICD-10-CM | POA: Insufficient documentation

## 2019-04-15 ENCOUNTER — Ambulatory Visit: Payer: Medicare HMO | Admitting: Nurse Practitioner

## 2019-08-21 NOTE — Progress Notes (Signed)
Cardiology Office Note  Date:  08/23/2019   ID:  Joshua Lane, Joshua Lane 04-21-1946, MRN [id number]  PCP:  Albina Billet, MD   Chief Complaint  Patient presents with  . office visit    DOE x 1 month; Meds vebrally reviewed with patient.    HPI:  Mr. Lisenby is a 74 year old gentleman with history of  hypertension,  smoking stopped 10 years ago,  type 2 diabetes,  chronic neck pain,  status post surgery of the cervical spine,  upper extremity neuropathy,  presenting for follow-up of his shortness of breath, chest tightness on exertion.  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 Some SOB, stable  Initial BP 160/100 Repeat by myself 120/80  After 10 min  Prior lab work Total chol 135,LDL 67 HAB1c 7.1 In 04/2019  EKG personally reviewed by myself on today's visit shows normal sinus rhythm with rate 79 bpm,  no significant ST or T wave abn   PMH:   has a past medical history of Diabetes mellitus without complication ([place]) and Hypertension.  PSH:    Past Surgical History:  Procedure Laterality Date  . APPENDECTOMY    . NECK SURGERY      Current Outpatient Medications  Medication Sig Dispense Refill  . amLODipine (NORVASC) 10 MG tablet Take 10 mg by mouth daily.  4  . aspirin EC 81 MG tablet Take 81 mg by mouth daily.    Marland Kitchen glimepiride (AMARYL) 1 MG tablet Take 1 mg by mouth daily with breakfast.    . losartan-hydrochlorothiazide (HYZAAR) 100-25 MG tablet Take 1 tablet by mouth daily.  5  . metFORMIN (GLUCOPHAGE) 500 MG tablet Take 1,000 mg by mouth 2 (two) times daily.  5  . omeprazole (PRILOSEC) 20 MG capsule Take 20 mg by mouth daily.    . pravastatin (PRAVACHOL) 20 MG tablet Take 20 mg by mouth daily.    Marland Kitchen sulfamethoxazole-trimethoprim (BACTRIM DS) 800-160 MG tablet Take 1 tablet by mouth 2 (two) times daily. 20 tablet 0   No  current facility-administered medications for this visit.     Allergies:   Penicillins   Social History:  The patient  reports that he quit smoking about 13 years ago. His smoking use included cigarettes. He has a 55.00 pack-year smoking history. His smokeless tobacco use includes snuff. He reports current alcohol use. He reports that he does not use drugs.   Family History:   Family history is unknown by patient.    Review of Systems: Review of Systems  Constitutional: Negative.   Respiratory: Negative.   Cardiovascular: Negative.   Gastrointestinal: Negative.   Musculoskeletal: Negative.   Neurological: Negative.   Psychiatric/Behavioral: Negative.   All other systems reviewed and are negative.   PHYSICAL EXAM: VS:  BP (!) 160/100 (BP Location: Left Arm, Patient Position: Sitting, Cuff Size: Normal)   Pulse 79   Ht 5\' 7"  (1.702 m)   Wt 219 lb 8 oz (99.6 kg)   SpO2 98%   BMI 34.38 kg/m  , BMI Body mass index is 34.38 kg/m. Constitutional:  oriented to person, place, and time. No distress.  HENT:  Head: Grossly normal Eyes:  no discharge. No scleral icterus.  Neck: No JVD, no carotid bruits  Cardiovascular: Regular rate and rhythm, no murmurs appreciated Pulmonary/Chest: Clear to auscultation bilaterally, no wheezes or rails Abdominal:  Soft.  no distension.  no tenderness.  Musculoskeletal: Normal range of motion Neurological:  normal muscle tone. Coordination normal. No atrophy Skin: Skin warm and dry Psychiatric: normal affect, pleasant   Recent Labs: No results found for requested labs within last 8760 hours.    Lipid Panel No results found for: CHOL, HDL, LDLCALC, TRIG    Wt Readings from Last 3 Encounters:  08/23/19 219 lb 8 oz (99.6 kg)  11/21/18 217 lb (98.4 kg)  09/11/16 217 lb 8 oz (98.7 kg)       ASSESSMENT AND PLAN:  Essential hypertension - Plan: EKG 12-Lead Blood pressure is well controlled on today's visit. No changes made to the  medications.  Type 2 diabetes mellitus with hyperosmolarity without coma, without [place]-term current use of insulin ([facility]) - HBA1C back up 7.1 We have encouraged continued exercise, careful diet management in an effort to lose weight.  Smoker -  Stopped smoking 10 years ago Chronic stable SOB  Hyperlipidemia Cholesterol is at goal on the current lipid regimen. No changes to the medications were made.    Total encounter time more than 25 minutes  Greater than 50% was spent in counseling and coordination of care with the patient   Disposition:   F/U  12 months   Orders Placed This Encounter  Procedures  . EKG 12-Lead     Signed, Esmond Plants, M.D., Ph.D. 08/23/2019  [place], [place]

## 2019-08-23 ENCOUNTER — Encounter: Payer: Self-pay | Admitting: Cardiovascular Disease

## 2019-08-23 ENCOUNTER — Other Ambulatory Visit: Payer: Self-pay

## 2019-08-23 ENCOUNTER — Ambulatory Visit (INDEPENDENT_AMBULATORY_CARE_PROVIDER_SITE_OTHER): Payer: Medicare HMO | Admitting: Cardiovascular Disease

## 2019-08-23 VITALS — BP 120/80 | HR 79 | Ht 67.0 in | Wt 219.5 lb

## 2019-08-23 DIAGNOSIS — R079 Chest pain, unspecified: Secondary | ICD-10-CM | POA: Diagnosis not present

## 2019-08-23 DIAGNOSIS — E118 Type 2 diabetes mellitus with unspecified complications: Secondary | ICD-10-CM | POA: Diagnosis not present

## 2019-08-23 DIAGNOSIS — I10 Essential (primary) hypertension: Secondary | ICD-10-CM | POA: Diagnosis not present

## 2019-08-23 NOTE — Patient Instructions (Signed)

## 2020-07-18 ENCOUNTER — Encounter: Payer: Self-pay | Admitting: Emergency Medicine

## 2020-07-18 ENCOUNTER — Emergency Department: Payer: Worker's Compensation

## 2020-07-18 ENCOUNTER — Emergency Department
Admission: EM | Admit: 2020-07-18 | Discharge: 2020-07-18 | Disposition: A | Discharge status: Home / Self Care | Payer: Worker's Compensation | Attending: Emergency Medicine | Admitting: Emergency Medicine

## 2020-07-18 ENCOUNTER — Other Ambulatory Visit: Payer: Self-pay

## 2020-07-18 DIAGNOSIS — Z79899 Other long term (current) drug therapy: Secondary | ICD-10-CM | POA: Insufficient documentation

## 2020-07-18 DIAGNOSIS — I10 Essential (primary) hypertension: Secondary | ICD-10-CM | POA: Insufficient documentation

## 2020-07-18 DIAGNOSIS — S2231XA Fracture of one rib, right side, initial encounter for closed fracture: Secondary | ICD-10-CM

## 2020-07-18 DIAGNOSIS — Z87891 Personal history of nicotine dependence: Secondary | ICD-10-CM | POA: Insufficient documentation

## 2020-07-18 DIAGNOSIS — K8020 Calculus of gallbladder without cholecystitis without obstruction: Secondary | ICD-10-CM

## 2020-07-18 DIAGNOSIS — Z7982 Long term (current) use of aspirin: Secondary | ICD-10-CM | POA: Diagnosis not present

## 2020-07-18 DIAGNOSIS — W19XXXA Unspecified fall, initial encounter: Secondary | ICD-10-CM | POA: Diagnosis not present

## 2020-07-18 DIAGNOSIS — S20221A Contusion of right back wall of thorax, initial encounter: Secondary | ICD-10-CM | POA: Diagnosis not present

## 2020-07-18 DIAGNOSIS — S299XXA Unspecified injury of thorax, initial encounter: Secondary | ICD-10-CM | POA: Diagnosis present

## 2020-07-18 DIAGNOSIS — R519 Headache, unspecified: Secondary | ICD-10-CM | POA: Diagnosis not present

## 2020-07-18 DIAGNOSIS — E119 Type 2 diabetes mellitus without complications: Secondary | ICD-10-CM | POA: Diagnosis not present

## 2020-07-18 DIAGNOSIS — Z7984 Long term (current) use of oral hypoglycemic drugs: Secondary | ICD-10-CM | POA: Insufficient documentation

## 2020-07-18 DIAGNOSIS — Y990 Civilian activity done for income or pay: Secondary | ICD-10-CM | POA: Diagnosis not present

## 2020-07-18 DIAGNOSIS — I7090 Unspecified atherosclerosis: Secondary | ICD-10-CM | POA: Diagnosis not present

## 2020-07-18 DIAGNOSIS — T1490XA Injury, unspecified, initial encounter: Secondary | ICD-10-CM

## 2020-07-18 DIAGNOSIS — T148XXA Other injury of unspecified body region, initial encounter: Secondary | ICD-10-CM

## 2020-07-18 LAB — BASIC METABOLIC PANEL
Anion gap: 12 (ref 5–15)
BUN: 27 mg/dL — ABNORMAL HIGH (ref 8–23)
CO2: 28 mmol/L (ref 22–32)
Calcium: 9.7 mg/dL (ref 8.9–10.3)
Chloride: 97 mmol/L — ABNORMAL LOW (ref 98–111)
Creatinine, Ser: 1.12 mg/dL (ref 0.61–1.24)
GFR, Estimated: >60 mL/min (ref >60)
Glucose, Bld: 157 mg/dL — ABNORMAL HIGH (ref 70–99)
Potassium: 3.8 mmol/L (ref 3.5–5.1)
Sodium: 137 mmol/L (ref 135–145)

## 2020-07-18 LAB — CBC WITH DIFFERENTIAL/PLATELET
Abs Immature Granulocytes: 0.03 10*3/uL (ref 0.00–0.07)
Basophils Absolute: 0.1 10*3/uL (ref 0.0–0.1)
Basophils Relative: 1 %
Eosinophils Absolute: 0.2 10*3/uL (ref 0.0–0.5)
Eosinophils Relative: 2 %
HCT: 45.6 % (ref 39.0–52.0)
Hemoglobin: 15.6 g/dL (ref 13.0–17.0)
Immature Granulocytes: 0 %
Lymphocytes Relative: 30 %
Lymphs Abs: 2.8 10*3/uL (ref 0.7–4.0)
MCH: 30.1 pg (ref 26.0–34.0)
MCHC: 34.2 g/dL (ref 30.0–36.0)
MCV: 87.9 fL (ref 80.0–100.0)
Monocytes Absolute: 1 10*3/uL (ref 0.1–1.0)
Monocytes Relative: 10 %
Neutro Abs: 5.5 10*3/uL (ref 1.7–7.7)
Neutrophils Relative %: 57 %
Platelets: 248 10*3/uL (ref 150–400)
RBC: 5.19 MIL/uL (ref 4.22–5.81)
RDW: 13.1 % (ref 11.5–15.5)
WBC: 9.6 10*3/uL (ref 4.0–10.5)
nRBC: 0 % (ref 0.0–0.2)

## 2020-07-18 LAB — TROPONIN I (HIGH SENSITIVITY): Troponin I (High Sensitivity): 6 ng/L (ref <18)

## 2020-07-18 MED ORDER — NAPROXEN 500 MG PO TABS
500.0000 mg | ORAL_TABLET | Freq: Once | ORAL | Status: AC
  Administered 2020-07-18: 500 mg via ORAL
  Filled 2020-07-18: qty 1

## 2020-07-18 MED ORDER — OXYCODONE-ACETAMINOPHEN 5-325 MG PO TABS
1.0000 | ORAL_TABLET | Freq: Once | ORAL | Status: AC
  Administered 2020-07-18: 1 via ORAL
  Filled 2020-07-18: qty 1

## 2020-07-18 MED ORDER — HYDROCODONE-ACETAMINOPHEN 5-325 MG PO TABS: 2.0000 | ORAL_TABLET | Freq: Four times a day (QID) | ORAL | 0 refills | Status: AC | PRN

## 2020-07-18 MED ORDER — ACETAMINOPHEN 325 MG PO TABS
650.0000 mg | ORAL_TABLET | Freq: Once | ORAL | Status: AC
  Administered 2020-07-18: 650 mg via ORAL
  Filled 2020-07-18: qty 2

## 2020-07-18 MED ORDER — CYCLOBENZAPRINE HCL 10 MG PO TABS
5.0000 mg | ORAL_TABLET | Freq: Once | ORAL | Status: AC
  Administered 2020-07-18: 5 mg via ORAL
  Filled 2020-07-18: qty 1

## 2020-07-18 NOTE — ED Provider Notes (Signed)
[facility] Emergency Department Provider Note  ____________________________________________   Event Date/Time   First MD Initiated Contact with Patient 07/18/20 1215     (approximate)  I have reviewed the triage vital signs and the nursing notes.   HISTORY  Chief Complaint Fall (/)   HPI Joshua Lane is a 75 y.o. male with past medical history of HTN and DM who presents for assessment after a ground-level fall that occurred earlier this morning.  Patient states he is not sure how or why he fell but does not remember how he felt before falling.  He thinks he may have lost consciousness and notes he hit his head as he had immediate headache and pain in his bilateral side of his chest and bilateral mid back.  He denies any abdominal pain, extremity pain, or pelvic pain.  Denies any face pain or neck pain.  He is not on any blood thinners.  He states prior to this he is otherwise in his usual state of health without any recent fevers, chills, cough, nausea, vomiting, diarrhea, dysuria, rash, vomiting or other recent cough or recent falls or injuries.         Past Medical History:  Diagnosis Date  . Diabetes mellitus without complication ([place])   . Hypertension     Patient Active Problem List   Diagnosis Date Noted  . Smoker 09/10/2016  . Other fatigue 05/15/2015  . Pain in the chest 05/15/2015  . Type 2 diabetes mellitus with hyperosmolarity without coma, without [place]-term current use of insulin ([place]) 05/15/2015  . Anginal pain ([place]) 05/15/2015  . Secondary hypertension, unspecified 05/15/2015  . Abnormal EKG 05/15/2015  . Essential hypertension 05/15/2015  . 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 OSTEOMYELITIS, ACUTE, UNSPECIFIED SITE 07/20/2006    Past Surgical History:  Procedure Laterality Date  . APPENDECTOMY    . NECK SURGERY      Prior to Admission medications   Medication Sig Start Date End Date Taking? Authorizing  Provider  HYDROcodone-acetaminophen (NORCO) 5-325 MG tablet Take 2 tablets by mouth every 6 (six) hours as needed for up to 3 days for moderate pain or severe pain. 07/18/20 07/21/20 Yes Lucrezia Starch, MD  amLODipine (NORVASC) 10 MG tablet Take 10 mg by mouth daily. 05/11/15   [provider]  aspirin EC 81 MG tablet Take 81 mg by mouth daily.    [provider]  glimepiride (AMARYL) 1 MG tablet Take 1 mg by mouth daily with breakfast.    [provider]  losartan-hydrochlorothiazide (HYZAAR) 100-25 MG tablet Take 1 tablet by mouth daily. 05/11/15   [provider]  metFORMIN (GLUCOPHAGE) 500 MG tablet Take 1,000 mg by mouth 2 (two) times daily. 05/05/15   [provider]  omeprazole (PRILOSEC) 20 MG capsule Take 20 mg by mouth daily.    [provider]  pravastatin (PRAVACHOL) 20 MG tablet Take 20 mg by mouth daily.    [provider]  sulfamethoxazole-trimethoprim (BACTRIM DS) 800-160 MG tablet Take 1 tablet by mouth 2 (two) times daily. 11/21/18   Menshew, Dannielle Karvonen, PA-C    Allergies Penicillins  Family History  Family history unknown: Yes    Social History Social History   Tobacco Use  . Smoking status: Former Smoker    Packs/day: 1.00    Years: 55.00    Pack years: 55.00    Types: Cigarettes    Quit date: 06/19/2006    Years since  quitting: 14.0  . Smokeless tobacco: Current User    Types: Snuff  Vaping Use  . Vaping Use: Never used  Substance Use Topics  . Alcohol use: Yes    Comment: Drinks 1 beer or mixed drink every other night.   . Drug use: No    Review of Systems  Review of Systems  Constitutional: Negative for chills and fever.  HENT: Negative for sore throat.   Eyes: Negative for pain.  Respiratory: Negative for cough and stridor.   Cardiovascular: Negative for chest pain.  Gastrointestinal: Negative for vomiting.  Genitourinary: Negative for dysuria.  Musculoskeletal: Positive for back  pain.  Skin: Negative for rash.  Neurological: Positive for headaches. Negative for seizures and loss of consciousness.  Psychiatric/Behavioral: Negative for suicidal ideas.  All other systems reviewed and are negative.     ____________________________________________   PHYSICAL EXAM:  VITAL SIGNS: ED Triage Vitals  Enc Vitals Group     BP 07/18/20 0808 (!) 145/87     Pulse Rate 07/18/20 0808 96     Resp 07/18/20 0808 18     Temp 07/18/20 0808 98.1 F (36.7 C)     Temp Source 07/18/20 0808 Oral     SpO2 07/18/20 0808 99 %     Weight 07/18/20 0804 219 lb 9.3 oz (99.6 kg)     Height 07/18/20 0804 5\' 7"  (1.702 m)     Head Circumference --      Peak Flow --      Pain Score 07/18/20 0804 7     Pain Loc --      Pain Edu? --      Excl. in [place]? --    Vitals:   07/18/20 1038 07/18/20 1239  BP: (!) 154/94 (!) 141/93  Pulse: 86 (!) 108  Resp: 16 19  Temp: 97.8 F (36.6 C)   SpO2: 99% 98%   Physical Exam Vitals and nursing note reviewed.  Constitutional:      Appearance: He is well-developed and well-nourished.  HENT:     Head: Normocephalic and atraumatic.     Right Ear: External ear normal.     Left Ear: External ear normal.     Nose: Nose normal.     Mouth/Throat:     Mouth: Mucous membranes are moist.  Eyes:     Conjunctiva/sclera: Conjunctivae normal.  Cardiovascular:     Rate and Rhythm: Normal rate and regular rhythm.     Heart sounds: No murmur heard.   Pulmonary:     Effort: Pulmonary effort is normal. No respiratory distress.     Breath sounds: Normal breath sounds.  Abdominal:     Palpations: Abdomen is soft.     Tenderness: There is no abdominal tenderness.  Musculoskeletal:        General: No edema.     Cervical back: Neck supple.     Right lower leg: No edema.     Left lower leg: No edema.  Skin:    General: Skin is warm and dry.     Capillary Refill: Capillary refill takes less than 2 seconds.  Neurological:     Mental Status: He is alert  and oriented to person, place, and time.  Psychiatric:        Mood and Affect: Mood and affect and mood normal.     Patient has cranial nerves II through XII grossly intact. Full and symmetric strength of all extremities. No step-offs tenderness or deformities over the C/T/L-spine. There are  some tenderness over the bilateral mid thoracic areas and bilateral mid axillary areas without significant overlying skin changes.  Approximately 1 cm hemostatic linear lack over the patient's right posterior scalp. No bleeding or significant hematoma or palpable skull fracture. Remainder patient's head scalp neck oropharynx unremarkable.  ____________________________________________   LABS (all labs ordered are listed, but only abnormal results are displayed)  Labs Reviewed  BASIC METABOLIC PANEL - Abnormal; Notable for the following components:      Result Value   Chloride 97 (*)    Glucose, Bld 157 (*)    BUN 27 (*)    All other components within normal limits  CBC WITH DIFFERENTIAL/PLATELET  TROPONIN I (HIGH SENSITIVITY)   ____________________________________________  EKG  Sinus rhythm with a ventricular rate of 81, normal axis, unremarkable levels, no clear evidence of acute ischemia although some artifact in lead II and aVF. ____________________________________________  RADIOLOGY  ED MD interpretation: There is possibly a right fifth rib fracture on chest x-ray CT chest and pelvis shows no evidence of rib fracture or other clear acute injury thoracic or abdominal findings including significant visceral or other traumatic injuries. CT head and C-spine showed no evidence of traumatic injuries. T and L reformats are unremarkable.  Official radiology report(s): DG Chest 2 View  Result Date: 07/18/2020 CLINICAL DATA:  Status post fall, hit head with bilateral rib pain. EXAM: CHEST - 2 VIEW COMPARISON:  Chest exam of November of 2015. FINDINGS: Signs of cervical spinal fusion not well  evaluated. Trachea midline. Cardiomediastinal contours and hilar structures normal. Lungs are clear.  No sign of pleural effusion. RIGHT anterolateral fifth rib fracture. Mild displacement. No additional rib fractures are visualized on chest radiograph, limited assessment. No pneumothorax. IMPRESSION: 1. RIGHT anterolateral fifth rib fracture with mild displacement. Limited assessment without additional signs of displaced rib fracture. 2. No active cardiopulmonary disease. Electronically Signed   By: Zetta Bills M.D.   On: 07/18/2020 08:42   CT Head Wo Contrast  Result Date: 07/18/2020 CLINICAL DATA:  Fall EXAM: CT HEAD WITHOUT CONTRAST TECHNIQUE: Contiguous axial images were obtained from the base of the skull through the vertex without intravenous contrast. COMPARISON:  None. FINDINGS: Brain: There is no acute intracranial hemorrhage, mass effect, or edema. Gray-white differentiation is preserved. There is no extra-axial fluid collection. Minimal patchy low attenuation in the supratentorial white matter is nonspecific but may reflect minor chronic microvascular ischemic changes. Ventricles and sulci are within normal limits in size and configuration. Vascular: No hyperdense vessel or unexpected calcification. Skull: Calvarium is unremarkable. Sinuses/Orbits: No acute finding. Other: Posterior scalp hematoma at the vertex. IMPRESSION: No evidence of acute intracranial injury. Electronically Signed   By: Macy Mis M.D.   On: 07/18/2020 08:36   CT Cervical Spine Wo Contrast  Result Date: 07/18/2020 CLINICAL DATA:  Fall EXAM: CT CERVICAL SPINE WITHOUT CONTRAST TECHNIQUE: Multidetector CT imaging of the cervical spine was performed without intravenous contrast. Multiplanar CT image reconstructions were also generated. COMPARISON:  None. FINDINGS: Alignment: No significant listhesis. Skull base and vertebrae: No acute fracture. Prior ACDF at C5-C7 with plate and screw fixation and well incorporated  interbody grafts. No evidence of hardware complication. Soft tissues and spinal canal: No prevertebral fluid or swelling. No visible canal hematoma. Disc levels: Degenerative changes are present primarily at C3-C4 without high-grade canal stenosis. Bilateral foraminal narrowing is present. Upper chest: No apical lung mass. Other: Mild calcified plaque at the left common carotid bifurcation. IMPRESSION: No acute cervical spine fracture. Electronically  Signed   By: Macy Mis M.D.   On: 07/18/2020 08:42   CT T-SPINE NO CHARGE  Result Date: 07/18/2020 CLINICAL DATA:  Fall EXAM: CT THORACIC SPINE WITHOUT CONTRAST TECHNIQUE: Multidetector CT images of the thoracic were obtained using the standard protocol without intravenous contrast. COMPARISON:  None. FINDINGS: Alignment: Normal Vertebrae: Negative for fracture.  Small bone island right lamina C4 Paraspinal and other soft tissues: Chest CT today reported separately Disc levels: Mild disc degeneration with mild disc space narrowing. Mild right anterior osteophyte formation T7 through T11. IMPRESSION: Negative for thoracic spine fracture. Electronically Signed   By: Franchot Gallo M.D.   On: 07/18/2020 13:18   CT L-SPINE NO CHARGE  Result Date: 07/18/2020 CLINICAL DATA:  Fall. EXAM: CT LUMBAR SPINE WITHOUT CONTRAST TECHNIQUE: Multidetector CT imaging of the lumbar spine was performed without intravenous contrast administration. Multiplanar CT image reconstructions were also generated. COMPARISON:  None. FINDINGS: Segmentation: Normal Alignment: Normal Vertebrae: Negative for fracture Paraspinal and other soft tissues: Mild atherosclerotic aorta without aneurysm. No paraspinous mass or soft tissue swelling Disc levels: L1-2: Bilateral facet degeneration. Mild spinal stenosis L2-3: Moderate to advanced facet degeneration. Moderate spinal stenosis. Moderate subarticular stenosis bilaterally L3-4: Moderate to advanced facet degeneration. Mild spinal stenosis.  Moderate subarticular stenosis bilaterally. L4-5: Diffuse disc bulging. Moderate to advanced facet degeneration. The mild to moderate spinal stenosis. Moderate subarticular stenosis bilaterally L5-S1: Mild disc and facet degeneration.  Negative for stenosis. IMPRESSION: Negative for lumbar fracture Multilevel degenerative change as above. Electronically Signed   By: Franchot Gallo M.D.   On: 07/18/2020 13:09   CT CHEST ABDOMEN PELVIS WO CONTRAST  Result Date: 07/18/2020 CLINICAL DATA:  Chest trauma.  Fall. EXAM: CT CHEST, ABDOMEN AND PELVIS WITHOUT CONTRAST TECHNIQUE: Multidetector CT imaging of the chest, abdomen and pelvis was performed following the standard protocol without IV contrast. COMPARISON:  None. FINDINGS: CT CHEST FINDINGS Cardiovascular: The heart size appears within normal limits. Mild aortic atherosclerosis. Coronary artery calcifications. No pericardial effusion. Mediastinum/Nodes: No enlarged mediastinal, hilar, or axillary lymph nodes. Thyroid gland, trachea, and esophagus demonstrate no significant findings. Lungs/Pleura: No pleural effusion, airspace consolidation, or atelectasis. No pulmonary contusion or pneumothorax. Musculoskeletal: No chest wall mass or suspicious bone lesions identified. CT ABDOMEN PELVIS FINDINGS Hepatobiliary: Diffuse hepatic steatosis. Stone within the gallbladder measures 2.6 cm. No gallbladder wall inflammation or signs of bile duct dilatation. Pancreas: Unremarkable. No pancreatic ductal dilatation or surrounding inflammatory changes. Spleen: Normal in size without focal abnormality. Adrenals/Urinary Tract: Normal appearance of the adrenal glands. Bilateral nephrolithiasis. Stones measure up to 3-4 mm. No hydronephrosis or hydroureter. Small stone within the dependent portion of the bladder measures 3 mm. Stomach/Bowel: The stomach is normal. Scattered colonic diverticula noted without acute inflammation. Vascular/Lymphatic: Aortic atherosclerosis. No aneurysm.  No abdominopelvic adenopathy. Reproductive: Prostate is unremarkable. Other: No free fluid or fluid collections. Bilateral fat containing inguinal hernias. Musculoskeletal: No acute or significant osseous findings. IMPRESSION: 1. No acute findings. 2. Aortic Atherosclerosis (ICD10-I70.0). Coronary artery calcifications 3. Hepatic steatosis. 4. Gallstone 5. Bilateral nephrolithiasis with 3 mm stone in the dependent portion of the bladder. Electronically Signed   By: Kerby Moors M.D.   On: 07/18/2020 13:30    ____________________________________________   PROCEDURES  Procedure(s) performed (including Critical Care):  Procedures   ____________________________________________   INITIAL IMPRESSION / ASSESSMENT AND PLAN / ED COURSE      Patient presents with above history exam for assessment after some bilateral back and chest pain after a fall. He  is afebrile and hemodynamically stable on arrival. He is neurovascular intact in all extremities and is some mild tenderness over his bilateral mid back and bilateral axillary areas. No other evidence of trauma to the patient's face or neck related to the small 1 cm linear hemostatic laceration of her scalp. Extremities are unremarkable. No significant injuries noted on CTs allergic possible right-sided initiated no fracture on x-ray. Certainly possible this is an artifact versus minimally displaced fracture although contusion is also in differential.  While description of fall sounds very much mechanical given patient is not 100% sure ECG troponin and basic labs were obtained. ECG shows no evidence of arrhythmia and given patient denies any chest pain has a nonelevated troponin low suspicion for ACS. CBC shows no acute anemia or other abnormalities. BMP shows no significant electrode or metabolic derangements.   Given possible rib fracture patient given incentive spirometer and instructed on proper use of next couple days. Below noted analgesia given  short course of Norco written. Discharged stable condition. Strict return precautions advised and discussed.       ____________________________________________   FINAL CLINICAL IMPRESSION(S) / ED DIAGNOSES  Final diagnoses:  Trauma  Fall  Atherosclerosis  Gallstones  Closed fracture of one rib of right side, initial encounter  Muscle strain  Contusion of right side of back, initial encounter    Medications  oxyCODONE-acetaminophen (PERCOCET/ROXICET) 5-325 MG per tablet 1 tablet (has no administration in time range)  acetaminophen (TYLENOL) tablet 650 mg (has no administration in time range)  cyclobenzaprine (FLEXERIL) tablet 5 mg (has no administration in time range)  naproxen (NAPROSYN) tablet 500 mg (has no administration in time range)     ED Discharge Orders         Ordered    HYDROcodone-acetaminophen (NORCO) 5-325 MG tablet  Every 6 hours PRN        07/18/20 1348           Note:  This document was prepared using Dragon voice recognition software and may include unintentional dictation errors.   Lucrezia Starch, MD 07/18/20 1355

## 2020-07-18 NOTE — ED Triage Notes (Signed)
Presents s/p fall   States he fell while going into work  [employer] of events and LOC

## 2020-07-25 ENCOUNTER — Other Ambulatory Visit: Payer: Self-pay

## 2020-08-01 ENCOUNTER — Other Ambulatory Visit
Admission: RE | Admit: 2020-08-01 | Discharge: 2020-08-01 | Disposition: A | Discharge status: Home / Self Care | Payer: Medicare HMO | Source: Ambulatory Visit | Attending: Ophthalmology | Admitting: Ophthalmology

## 2020-08-01 ENCOUNTER — Other Ambulatory Visit: Payer: Self-pay

## 2020-08-01 DIAGNOSIS — Z20822 Contact with and (suspected) exposure to covid-19: Secondary | ICD-10-CM | POA: Insufficient documentation

## 2020-08-01 DIAGNOSIS — Z01812 Encounter for preprocedural laboratory examination: Secondary | ICD-10-CM | POA: Diagnosis present

## 2020-08-01 LAB — SARS CORONAVIRUS 2 (TAT 6-24 HRS): SARS Coronavirus 2: NEGATIVE

## 2020-08-01 NOTE — Discharge Instructions (Signed)
INSTRUCTIONS FOLLOWING OCULOPLASTIC SURGERY AMY M. FOWLER, MD  AFTER YOUR EYE SURGERY, THER ARE MANY THINGS WHICH YOU, THE PATIENT, CAN DO TO ASSURE THE BEST POSSIBLE RESULT FROM YOUR OPERATION.  THIS SHEET SHOULD BE REFERRED TO WHENEVER QUESTIONS ARISE.  IF THERE ARE ANY QUESTIONS NOT ANSWERED HERE, DO NOT HESITATE TO CALL OUR OFFICE AT [phone] OR [phone].  THERE IS ALWAYS SOMEONE AVAILABLE TO CALL IF QUESTIONS OR PROBLEMS ARISE.  VISION: Your vision may be blurred and out of focus after surgery until you are able to stop using your ointment, swelling resolves and your eye(s) heal. This may take 1 to 2 weeks at the least.  If your vision becomes gradually more dim or dark, this is not normal and you need to call our office immediately.  EYE CARE: For the first 48 hours after surgery, use ice packs frequently - "20 minutes on, 20 minutes off" - to help reduce swelling and bruising.  Small bags of frozen peas or corn make good ice packs along with cloths soaked in ice water.  If you are wearing a patch or other type of dressing following surgery, keep this on for the amount of time specified by your doctor.  For the first week following surgery, you will need to treat your stitches with great care.  It is OK to shower, but take care to not allow soapy water to run into your eye(s) to help reduce chances of infection.  You may gently clean the eyelashes and around the eye(s) with cotton balls and sterile water, BUT DO NOT RUB THE STITCHES VIGOROUSLY.  Keeping your stitches moist with ointment will help promote healing with minimal scar formation.  ACTIVITY: When you leave the surgery center, you should go home, rest and be inactive.  The eye(s) may feel scratchy and keeping the eyes closed will allow for faster healing.  The first week following surgery, avoid straining (anything making the face turn red) or lifting over 20 pounds.  Additionally, avoid bending which causes your head to go below  your waist.  Using your eyes will NOT harm them, so feel free to read, watch television, use the computer, etc as desired.  Driving depends on each individual, so check with your doctor if you have questions about driving. Do not wear contact lenses for about 2 weeks.  Do not wear eye makeup for 2 weeks.  Avoid swimming, hot tubs, gardening, and dusting for 1 to 2 weeks to reduce the risk of an infection.  MEDICATIONS:  You will be given a prescription for an ointment to use 4 times a day on your stitches.  You can use the ointment in your eyes if they feel scratchy or irritated.  If you eyelid(s) don't close completely when you sleep, put some ointment in your eyes before bedtime.  EMERGENCY: If you experience SEVERE EYE PAIN OR HEADACHE UNRELIEVED BY TYLENOL OR TRAMADOL, NAUSEA OR VOMITING, WORSENING REDNESS, OR WORSENING VISION (ESPECIALLY VISION THAT WAS INITIALLY BETTER) CALL [phone] OR [phone] DURING BUSINESS HOURS OR AFTER HOURS. General Anesthesia, Adult, Care After This sheet gives you information about how to care for yourself after your procedure. Your health care provider may also give you more specific instructions. If you have problems or questions, contact your health care provider. What can I expect after the procedure? After the procedure, the following side effects are common:  Pain or discomfort at the IV site.  Nausea.  Vomiting.  Sore throat.  Trouble concentrating.  Feeling   cold or chills.  Feeling weak or tired.  Sleepiness and fatigue.  Soreness and body aches. These side effects can affect parts of the body that were not involved in surgery. Follow these instructions at home: For the time period you were told by your health care provider:  Rest.  Do not participate in activities where you could fall or become injured.  Do not drive or use machinery.  Do not drink alcohol.  Do not take sleeping pills or medicines that cause drowsiness.  Do  not make important decisions or sign legal documents.  Do not take care of children on your own.   Eating and drinking  Follow any instructions from your health care provider about eating or drinking restrictions.  When you feel hungry, start by eating small amounts of foods that are soft and easy to digest (bland), such as toast. Gradually return to your regular diet.  Drink enough fluid to keep your urine pale yellow.  If you vomit, rehydrate by drinking water, juice, or clear broth. General instructions  If you have sleep apnea, surgery and certain medicines can increase your risk for breathing problems. Follow instructions from your health care provider about wearing your sleep device: ? Anytime you are sleeping, including during daytime naps. ? While taking prescription pain medicines, sleeping medicines, or medicines that make you drowsy.  Have a responsible adult stay with you for the time you are told. It is important to have someone help care for you until you are awake and alert.  Return to your normal activities as told by your health care provider. Ask your health care provider what activities are safe for you.  Take over-the-counter and prescription medicines only as told by your health care provider.  If you smoke, do not smoke without supervision.  Keep all follow-up visits as told by your health care provider. This is important. Contact a health care provider if:  You have nausea or vomiting that does not get better with medicine.  You cannot eat or drink without vomiting.  You have pain that does not get better with medicine.  You are unable to pass urine.  You develop a skin rash.  You have a fever.  You have redness around your IV site that gets worse. Get help right away if:  You have difficulty breathing.  You have chest pain.  You have blood in your urine or stool, or you vomit blood. Summary  After the procedure, it is common to have a sore  throat or nausea. It is also common to feel tired.  Have a responsible adult stay with you for the time you are told. It is important to have someone help care for you until you are awake and alert.  When you feel hungry, start by eating small amounts of foods that are soft and easy to digest (bland), such as toast. Gradually return to your regular diet.  Drink enough fluid to keep your urine pale yellow.  Return to your normal activities as told by your health care provider. Ask your health care provider what activities are safe for you. This information is not intended to replace advice given to you by your health care provider. Make sure you discuss any questions you have with your health care provider. Document Revised: 03/02/2020 Document Reviewed: 09/30/2019 Elsevier Patient Education  2021 [employer]  

## 2020-08-03 ENCOUNTER — Encounter: Payer: Self-pay | Admitting: Ophthalmology

## 2020-08-03 ENCOUNTER — Ambulatory Visit: Payer: Medicare HMO | Admitting: Anesthesiology

## 2020-08-03 ENCOUNTER — Other Ambulatory Visit: Payer: Self-pay

## 2020-08-03 ENCOUNTER — Encounter
Admission: RE | Disposition: A | Discharge status: Home / Self Care | Payer: Self-pay | Source: Home / Self Care | Attending: Ophthalmology

## 2020-08-03 ENCOUNTER — Ambulatory Visit
Admission: RE | Admit: 2020-08-03 | Discharge: 2020-08-03 | Disposition: A | Discharge status: Home / Self Care | Payer: Medicare HMO | Attending: Ophthalmology | Admitting: Ophthalmology

## 2020-08-03 DIAGNOSIS — H02834 Dermatochalasis of left upper eyelid: Secondary | ICD-10-CM | POA: Insufficient documentation

## 2020-08-03 DIAGNOSIS — Z79899 Other long term (current) drug therapy: Secondary | ICD-10-CM | POA: Diagnosis not present

## 2020-08-03 DIAGNOSIS — H02833 Dermatochalasis of right eye, unspecified eyelid: Secondary | ICD-10-CM | POA: Diagnosis not present

## 2020-08-03 DIAGNOSIS — Z7982 Long term (current) use of aspirin: Secondary | ICD-10-CM | POA: Insufficient documentation

## 2020-08-03 DIAGNOSIS — Z7984 Long term (current) use of oral hypoglycemic drugs: Secondary | ICD-10-CM | POA: Insufficient documentation

## 2020-08-03 HISTORY — PX: BROW LIFT: SHX178

## 2020-08-03 LAB — GLUCOSE, CAPILLARY
Glucose-Capillary: 173 mg/dL — ABNORMAL HIGH (ref 70–99)
Glucose-Capillary: 175 mg/dL — ABNORMAL HIGH (ref 70–99)

## 2020-08-03 SURGERY — BLEPHAROPLASTY
Anesthesia: General | Site: Eye | Laterality: Bilateral

## 2020-08-03 MED ORDER — PROPOFOL 500 MG/50ML IV EMUL
INTRAVENOUS | Status: DC | PRN
  Administered 2020-08-03: 25 ug/kg/min via INTRAVENOUS

## 2020-08-03 MED ORDER — BSS IO SOLN
INTRAOCULAR | Status: DC | PRN
  Administered 2020-08-03: 30 mL via INTRAOCULAR

## 2020-08-03 MED ORDER — MIDAZOLAM HCL 2 MG/2ML IJ SOLN
INTRAMUSCULAR | Status: DC | PRN
  Administered 2020-08-03 (×2): 1 mg via INTRAVENOUS

## 2020-08-03 MED ORDER — ACETAMINOPHEN 325 MG PO TABS: 325.0000 mg | ORAL_TABLET | ORAL | Status: DC | PRN

## 2020-08-03 MED ORDER — FENTANYL CITRATE (PF) 100 MCG/2ML IJ SOLN: 25.0000 ug | INTRAMUSCULAR | Status: DC | PRN

## 2020-08-03 MED ORDER — TRAMADOL HCL 50 MG PO TABS: ORAL_TABLET | ORAL | 0 refills | Status: DC

## 2020-08-03 MED ORDER — OXYCODONE HCL 5 MG PO TABS
5.0000 mg | ORAL_TABLET | Freq: Once | ORAL | Status: DC | PRN
Start: 2020-08-03 — End: 2020-08-03

## 2020-08-03 MED ORDER — ACETAMINOPHEN 160 MG/5ML PO SOLN: 325.0000 mg | ORAL | Status: DC | PRN

## 2020-08-03 MED ORDER — LIDOCAINE HCL (CARDIAC) PF 100 MG/5ML IV SOSY
PREFILLED_SYRINGE | INTRAVENOUS | Status: DC | PRN
  Administered 2020-08-03: 50 mg via INTRAVENOUS

## 2020-08-03 MED ORDER — ERYTHROMYCIN 5 MG/GM OP OINT
TOPICAL_OINTMENT | OPHTHALMIC | Status: DC | PRN
  Administered 2020-08-03: 1 via OPHTHALMIC

## 2020-08-03 MED ORDER — ALFENTANIL 500 MCG/ML IJ INJ
INJECTION | INTRAVENOUS | Status: DC | PRN
  Administered 2020-08-03: 200 ug via INTRAVENOUS
  Administered 2020-08-03: 300 ug via INTRAVENOUS
  Administered 2020-08-03: 500 ug via INTRAVENOUS

## 2020-08-03 MED ORDER — TETRACAINE HCL 0.5 % OP SOLN
OPHTHALMIC | Status: DC | PRN
  Administered 2020-08-03: 2 [drp] via OPHTHALMIC

## 2020-08-03 MED ORDER — OXYCODONE HCL 5 MG/5ML PO SOLN: 5.0000 mg | Freq: Once | ORAL | Status: DC | PRN

## 2020-08-03 MED ORDER — LACTATED RINGERS IV SOLN: INTRAVENOUS | Status: DC

## 2020-08-03 MED ORDER — LIDOCAINE-EPINEPHRINE 2 %-1:100000 IJ SOLN
INTRAMUSCULAR | Status: DC | PRN
  Administered 2020-08-03: .5 mL via OPHTHALMIC
  Administered 2020-08-03: 1 mL via OPHTHALMIC
  Administered 2020-08-03: 2 mL via OPHTHALMIC

## 2020-08-03 MED ORDER — ERYTHROMYCIN 5 MG/GM OP OINT: TOPICAL_OINTMENT | OPHTHALMIC | 2 refills | Status: DC

## 2020-08-03 SURGICAL SUPPLY — 22 items
APPLICATOR COTTON TIP WD 3 STR (MISCELLANEOUS) ×2 IMPLANT
BLADE SURG 15 STRL LF DISP TIS (BLADE) ×1 IMPLANT
BLADE SURG 15 STRL SS (BLADE) ×2
CORD BIP STRL DISP 12FT (MISCELLANEOUS) ×2 IMPLANT
DRAPE HEAD BAR (DRAPES) ×2 IMPLANT
GAUZE SPONGE 4X4 12PLY STRL (GAUZE/BANDAGES/DRESSINGS) ×2 IMPLANT
GLOVE SURG LX 7.0 MICRO (GLOVE) ×2
GLOVE SURG LX STRL 7.0 MICRO (GLOVE) ×2 IMPLANT
GOWN STRL REUS W/ TWL LRG LVL3 (GOWN DISPOSABLE) ×1 IMPLANT
GOWN STRL REUS W/TWL LRG LVL3 (GOWN DISPOSABLE) ×2
MARKER SKIN XFINE TIP W/RULER (MISCELLANEOUS) ×2 IMPLANT
NEEDLE FILTER BLUNT 18X 1/2SAF (NEEDLE) ×1
NEEDLE FILTER BLUNT 18X1 1/2 (NEEDLE) ×1 IMPLANT
NEEDLE HYPO 30X.5 LL (NEEDLE) ×4 IMPLANT
PACK ENT CUSTOM (PACKS) ×2 IMPLANT
SOL PREP PVP 2OZ (MISCELLANEOUS) ×2
SOLUTION PREP PVP 2OZ (MISCELLANEOUS) ×1 IMPLANT
SPONGE GAUZE 2X2 8PLY STRL LF (GAUZE/BANDAGES/DRESSINGS) ×20 IMPLANT
SUT GUT PLAIN 6-0 1X18 ABS (SUTURE) ×2 IMPLANT
SYR 10ML LL (SYRINGE) ×2 IMPLANT
SYR 3ML LL SCALE MARK (SYRINGE) ×2 IMPLANT
WATER STERILE IRR 250ML POUR (IV SOLUTION) ×2 IMPLANT

## 2020-08-03 NOTE — Anesthesia Postprocedure Evaluation (Signed)
Anesthesia Post Note  Patient: Joshua Lane  Procedure(s) Performed: BLEPHAROPLASTY UPPER EYELID WIT EXCESS SKIN DIABETIC (Bilateral Eye)     Patient location during evaluation: PACU Anesthesia Type: General Level of consciousness: awake Pain management: pain level controlled Vital Signs Assessment: post-procedure vital signs reviewed and stable Respiratory status: respiratory function stable Cardiovascular status: stable Postop Assessment: no apparent nausea or vomiting Anesthetic complications: no   No complications documented.  Veda Canning

## 2020-08-03 NOTE — Op Note (Signed)
Preoperative Diagnosis:  Visually significant dermatochalasis bilateral  Upper Eyelid(s)  Postoperative Diagnosis:  Same.  Procedure(s) Performed:   Upper eyelid blepharoplasty with excess skin excision  both  Upper Eyelid(s)  Surgeon: Philis Pique. Vickki Muff, M.D.  Assistants: none  Anesthesia: MAC  Specimens: None.  Estimated Blood Loss: Minimal.  Complications: None.  Operative Findings: None Dictated  Procedure:   Allergies were reviewed and the patient is allergic to Penicillins.   After the risks, benefits, complications and alternatives were discussed with the patient, appropriate informed consent was obtained and the patient was brought to the operating suite. The patient was reclined supine and a timeout was conducted.  The patient was then sedated.  Local anesthetic consisting of a 50-50 mixture of 2% lidocaine with epinephrine and 0.75% bupivacaine with added Hylenex was injected subcutaneously to both  upper eyelid(s). After adequate local was instilled, the patient was prepped and draped in the usual sterile fashion for eyelid surgery.   Attention was turned to the upper eyelids. A 28m upper eyelid crease incision line was marked with calipers on both  upper eyelid(s).  A pinch test was used to estimate the amount of excess skin to remove and this was marked in standard blepharoplasty style fashion. Attention was turned to the  right  upper eyelid. A #15 blade was used to open the premarked incision line. A Skin and muscle flap was excised and hemostasis was obtained with bipolar cautery.   A buttonhole was created medially in orbicularis and orbital septum to reveal the medial fat pocket. This was dissected free from fascial attachments, cauterized towards the pedicle base and excised to produce a nice flattening of the medial corner of the upper eyelid.  Attention was then turned to the opposite eyelid where the same procedure was performed in the same manner. Hemostasis was  obtained with bipolar cautery throughout. All incisions were then closed with a combination of running and interrupted 6-0 fast absorbing plain suture. The patient tolerated the procedure well.  Erythromycin ophthalmic ointment was applied to his incision sites, followed by ice packs. He was taken to the recovery area where he recovered without difficulty.  Post-Op Plan/Instructions:  The patient was instructed to use ice packs frequently for the next 48 hours. He was instructed to use Erythromycin ophthalmic ointment on his incisions 4 times a day for the next 12 to 14 days. He was given a prescription for tramadol (or similar) for pain control should Tylenol not be effective. He was asked to [facility] [place] as needed for problems.  Lindsee Labarre M. FVickki Muff M.D. Ophthalmology

## 2020-08-03 NOTE — Anesthesia Procedure Notes (Signed)
Performed by: Yvana Samonte, CRNA Pre-anesthesia Checklist: Patient identified, Emergency Drugs available, Suction available, Timeout performed and Patient being monitored Patient Re-evaluated:Patient Re-evaluated prior to induction Oxygen Delivery Method: Nasal cannula Placement Confirmation: positive ETCO2       

## 2020-08-03 NOTE — Anesthesia Preprocedure Evaluation (Signed)
Anesthesia Evaluation  Patient identified by MRN, date of birth, ID band Patient awake    Reviewed: Allergy & Precautions, NPO status , Patient's Chart, lab work & pertinent test results  Airway Mallampati: II  TM Distance: >3 FB     Dental   Pulmonary neg recent URI, former smoker (55 pack years, quiet 2007),    breath sounds clear to auscultation       Cardiovascular hypertension,  Rhythm:Regular Rate:Normal  HLD   Neuro/Psych  Headaches,    GI/Hepatic neg GERD  ,  Endo/Other  diabetes, Type 2Hypothyroidism   Renal/GU      Musculoskeletal  (+) Arthritis ,   Abdominal   Peds  Hematology   Anesthesia Other Findings   Reproductive/Obstetrics                             Anesthesia Physical Anesthesia Plan  ASA: III  Anesthesia Plan: General   Post-op Pain Management:    Induction: Intravenous  PONV Risk Score and Plan: Propofol infusion, TIVA and Treatment may vary due to age or medical condition  Airway Management Planned: Natural Airway and Nasal Cannula  Additional Equipment:   Intra-op Plan:   Post-operative Plan:   Informed Consent: I have reviewed the patients History and Physical, chart, labs and discussed the procedure including the risks, benefits and alternatives for the proposed anesthesia with the patient or authorized representative who has indicated his/her understanding and acceptance.       Plan Discussed with: CRNA  Anesthesia Plan Comments:         Anesthesia Quick Evaluation

## 2020-08-03 NOTE — H&P (Signed)
See the history and physical completed at [facility] on 07/25/2020 and scanned into the chart.

## 2020-08-03 NOTE — Transfer of Care (Signed)
Immediate Anesthesia Transfer of Care Note  Patient: Jarmel J Blackwelder  Procedure(s) Performed: BLEPHAROPLASTY UPPER EYELID WIT EXCESS SKIN DIABETIC (Bilateral Eye)  Patient Location: PACU  Anesthesia Type: General  Level of Consciousness: awake, alert  and patient cooperative  Airway and Oxygen Therapy: Patient Spontanous Breathing and Patient connected to supplemental oxygen  Post-op Assessment: Post-op Vital signs reviewed, Patient's Cardiovascular Status Stable, Respiratory Function Stable, Patent Airway and No signs of Nausea or vomiting  Post-op Vital Signs: Reviewed and stable  Complications: No complications documented.

## 2020-08-03 NOTE — Interval H&P Note (Signed)
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  The various methods of treatment have been discussed with the patient and family. After consideration of risks, benefits and other options for treatment, the patient has consented to  Procedure(s) with comments: BLEPHAROPLASTY UPPER EYELID WIT EXCESS SKIN DIABETIC (Bilateral) - Diabetic as a surgical intervention.  The patient's history has been reviewed, patient examined, no change in status, stable for surgery.  I have reviewed the patient's chart and labs.  Questions were answered to the patient's satisfaction.     Vickki Muff, Sherion Dooly M

## 2020-08-04 ENCOUNTER — Encounter: Payer: Self-pay | Admitting: Ophthalmology

## 2020-09-20 ENCOUNTER — Other Ambulatory Visit: Payer: Self-pay

## 2020-09-20 ENCOUNTER — Ambulatory Visit: Payer: Medicare HMO | Admitting: Dermatology

## 2020-09-20 DIAGNOSIS — L570 Actinic keratosis: Secondary | ICD-10-CM

## 2020-09-20 DIAGNOSIS — L720 Epidermal cyst: Secondary | ICD-10-CM

## 2020-09-20 DIAGNOSIS — L219 Seborrheic dermatitis, unspecified: Secondary | ICD-10-CM | POA: Diagnosis not present

## 2020-09-20 DIAGNOSIS — C44319 Basal cell carcinoma of skin of other parts of face: Secondary | ICD-10-CM | POA: Diagnosis not present

## 2020-09-20 DIAGNOSIS — L578 Other skin changes due to chronic exposure to nonionizing radiation: Secondary | ICD-10-CM

## 2020-09-20 DIAGNOSIS — D485 Neoplasm of uncertain behavior of skin: Secondary | ICD-10-CM

## 2020-09-20 DIAGNOSIS — C4491 Basal cell carcinoma of skin, unspecified: Secondary | ICD-10-CM

## 2020-09-20 HISTORY — DX: Basal cell carcinoma of skin, unspecified: C44.91

## 2020-09-20 MED ORDER — HYDROCORTISONE 2.5 % EX LOTN: TOPICAL_LOTION | CUTANEOUS | 6 refills | Status: DC

## 2020-09-20 MED ORDER — KETOCONAZOLE 2 % EX SHAM: 1.0000 "application " | MEDICATED_SHAMPOO | CUTANEOUS | 6 refills | Status: DC

## 2020-09-20 NOTE — Progress Notes (Signed)
New Patient Visit  Subjective  Joshua Lane is a 75 y.o. male who presents for the following: Other (Spot of left temple that has been there for a Montee time but never completely heals). He also has a rash on his face and behind the ears and beard and mustache area that is pink and scaly.  He also has a growth on his back he would like checked.  He also have a rough spots on his face.  Accompanied by wife who contributes to history  The following portions of the chart were reviewed this encounter and updated as appropriate:   Tobacco  Allergies  Meds  Problems  Med Hx  Surg Hx  Fam Hx     Review of Systems:  No other skin or systemic complaints except as noted in HPI or Assessment and Plan.  Objective  Well appearing patient in no apparent distress; mood and affect are within normal limits.  A focused examination was performed including face, scalp, back. Relevant physical exam findings are noted in the Assessment and Plan.  Objective  Left temple: 2.0 x 1.0 cm crusted papule     Objective  nasolabial, postauricular, brow area, breard area, glabella: Pinkness and scale  Objective  sacral area: 1.5 cm Subcutaneous nodule.   Objective  Left Eyebrow x1, right temple x 1 (2): Erythematous thin papules/macules with gritty scale.    Assessment & Plan    Actinic Damage - chronic, secondary to cumulative UV radiation exposure/sun exposure over time - diffuse scaly erythematous macules with underlying dyspigmentation - Recommend daily broad spectrum sunscreen SPF 30+ to sun-exposed areas, reapply every 2 hours as needed.  - Recommend staying in the shade or wearing Yuille sleeves, sun glasses (UVA+UVB protection) and wide brim hats (4-inch brim around the entire circumference of the hat). - Call for new or changing lesions.   Neoplasm of uncertain behavior of skin Left temple  Skin / nail biopsy Type of biopsy: tangential   Informed consent: discussed and consent  obtained   Timeout: patient name, date of birth, surgical site, and procedure verified   Procedure prep:  Patient was prepped and draped in usual sterile fashion Prep type:  Isopropyl alcohol Anesthesia: the lesion was anesthetized in a standard fashion   Anesthetic:  1% lidocaine w/ epinephrine 1-100,000 buffered w/ 8.4% NaHCO3 Instrument used: flexible razor blade   Hemostasis achieved with: pressure, aluminum chloride and electrodesiccation   Outcome: patient tolerated procedure well   Post-procedure details: sterile dressing applied and wound care instructions given   Dressing type: bandage and petrolatum    Specimen 1 - Surgical pathology Differential Diagnosis: BCC vs other  Check Margins: No 2.0 x 1.0 cm crusted papule  Will plan MOHs if +BCC  Seborrheic dermatitis nasolabial, postauricular, brow area, breard area, glabella D/C TMC 0.1% cream - advised patient it is a moderately strong steroid and he should avoid using on his face.  Start Ketoconazole 2% Shampoo wash face and scalp 3 times per week,  Hydrocortisone 2.5% lotion 3 times per week to face (Monday, Wednesday, Friday)  Seborrheic Dermatitis  -  is a chronic persistent rash characterized by pinkness and scaling most commonly of the mid face but also can occur on the scalp (dandruff), ears; mid chest and mid back. It tends to be exacerbated by stress and cooler weather.  People who have neurologic disease may experience new onset or exacerbation of existing seborrheic dermatitis.  The condition is not curable but treatable and  can be controlled.  ketoconazole (NIZORAL) 2 % shampoo - nasolabial, postauricular, brow area, breard area, glabella  hydrocortisone 2.5 % lotion - nasolabial, postauricular, brow area, breard area, glabella  Epidermal inclusion cyst sacral area Discussed excision. Patient will schedule surgery appointment.  AK (actinic keratosis) (2) Left Eyebrow x1, right temple x 1 Destruction of  lesion - Left Eyebrow x1, right temple x 1 Complexity: simple   Destruction method: cryotherapy   Informed consent: discussed and consent obtained   Timeout:  patient name, date of birth, surgical site, and procedure verified Lesion destroyed using liquid nitrogen: Yes   Region frozen until ice ball extended beyond lesion: Yes   Outcome: patient tolerated procedure well with no complications   Post-procedure details: wound care instructions given    Return today (on 09/20/2020) for Surgery for cyst of sacral area and 4 month follow up.  I, Ashok Cordia, CMA, am acting as scribe for [name], MD .  Documentation: I have reviewed the above documentation for accuracy and completeness, and I agree with the above.  [name], MD

## 2020-09-20 NOTE — Patient Instructions (Signed)

## 2020-09-20 NOTE — Progress Notes (Deleted)
   Follow-Up Visit   Subjective  BARON PARMELEE is a 75 y.o. male who presents for the following: Other (Spot of left temple that has been there for a Tooker time but never completely heals).  ***  The following portions of the chart were reviewed this encounter and updated as appropriate:       Review of Systems:  No other skin or systemic complaints except as noted in HPI or Assessment and Plan.  Objective  Well appearing patient in no apparent distress; mood and affect are within normal limits.  {[id number]::"D full examination was performed including scalp, head, eyes, ears, nose, lips, neck, chest, axillae, abdomen, back, buttocks, bilateral upper extremities, bilateral lower extremities, hands, feet, fingers, toes, fingernails, and toenails. All findings within normal limits unless otherwise noted below."}  Objective  Left temple: 2.0 x 1.0 cm crusted papule  Objective  nasolabial, postauricular, brow area, breard area, glabella: Pinkness and scale  Objective  sacral area: 1.5 cm Subcutaneous nodule.   Objective  Left Eyebrow: Erythematous thin papules/macules with gritty scale.     Assessment & Plan  Neoplasm of uncertain behavior of skin Left temple  Skin / nail biopsy Type of biopsy: tangential   Informed consent: discussed and consent obtained   Timeout: patient name, date of birth, surgical site, and procedure verified   Procedure prep:  Patient was prepped and draped in usual sterile fashion Prep type:  Isopropyl alcohol Anesthesia: the lesion was anesthetized in a standard fashion   Anesthetic:  1% lidocaine w/ epinephrine 1-100,000 buffered w/ 8.4% NaHCO3 Instrument used: flexible razor blade   Hemostasis achieved with: pressure, aluminum chloride and electrodesiccation   Outcome: patient tolerated procedure well   Post-procedure details: sterile dressing applied and wound care instructions given   Dressing type: bandage and petrolatum    Specimen 1 -  Surgical pathology Differential Diagnosis: BCC vs other  Check Margins: No 2.0 x 1.0 cm crusted papule  Will plan MOHs if +BCC  Seborrheic dermatitis nasolabial, postauricular, brow area, breard area, glabella  D/C TMC 0.1% cream - advised patient it is a steroid and he should avoid using on his face.  Start Ketoconazole 2% Shampoo wash face and scalp 3 times per week, Hydrocortisone 2.5% lotion 3 times per week to face (Monday, Wednesday, Friday)   ketoconazole (NIZORAL) 2 % shampoo - nasolabial, postauricular, brow area, breard area, glabella  hydrocortisone 2.5 % lotion - nasolabial, postauricular, brow area, breard area, glabella  Epidermal inclusion cyst sacral area  Discussed excision. Patient will schedule surgery appointment.  AK (actinic keratosis) Left Eyebrow  Destruction of lesion - Left Eyebrow Complexity: simple   Destruction method: cryotherapy   Informed consent: discussed and consent obtained   Timeout:  patient name, date of birth, surgical site, and procedure verified Lesion destroyed using liquid nitrogen: Yes   Region frozen until ice ball extended beyond lesion: Yes   Outcome: patient tolerated procedure well with no complications   Post-procedure details: wound care instructions given     Return today (on 09/20/2020) for Surgery for cyst of sacral area and 4 month follow up.

## 2020-09-21 ENCOUNTER — Encounter: Payer: Self-pay | Admitting: Dermatology

## 2020-09-25 ENCOUNTER — Telehealth: Payer: Self-pay

## 2020-09-25 NOTE — Telephone Encounter (Signed)
LM on VM please call here to discuss biopsy results

## 2020-09-25 NOTE — Telephone Encounter (Signed)
-----   Message from Ralene Bathe, MD sent at 09/25/2020 10:25 AM EDT ----- Diagnosis Skin , left temple, shave BASAL CELL CARCINOMA WITH FOCAL SCLEROSIS, CRUSTED  Cancer - BCC (with sclerosis) As suspected Schedule for MOHS

## 2020-09-26 ENCOUNTER — Telehealth: Payer: Self-pay

## 2020-09-26 DIAGNOSIS — C44319 Basal cell carcinoma of skin of other parts of face: Secondary | ICD-10-CM

## 2020-09-26 NOTE — Telephone Encounter (Signed)
-----   Message from Ralene Bathe, MD sent at 09/25/2020 10:25 AM EDT ----- Diagnosis Skin , left temple, shave BASAL CELL CARCINOMA WITH FOCAL SCLEROSIS, CRUSTED  Cancer - BCC (with sclerosis) As suspected Schedule for MOHS

## 2020-09-26 NOTE — Telephone Encounter (Signed)
Patient's spouse Hassan Rowan) on [employer]IP[employer] advised of biopsy results. Referral sent to Dr. Lacinda Axon for [facility]

## 2020-10-10 ENCOUNTER — Encounter: Payer: Self-pay | Admitting: Dermatology

## 2020-10-10 ENCOUNTER — Ambulatory Visit (INDEPENDENT_AMBULATORY_CARE_PROVIDER_SITE_OTHER): Payer: Medicare HMO | Admitting: Dermatology

## 2020-10-10 ENCOUNTER — Other Ambulatory Visit: Payer: Self-pay

## 2020-10-10 DIAGNOSIS — D485 Neoplasm of uncertain behavior of skin: Secondary | ICD-10-CM

## 2020-10-10 DIAGNOSIS — L720 Epidermal cyst: Secondary | ICD-10-CM

## 2020-10-10 MED ORDER — MUPIROCIN 2 % EX OINT: 1.0000 "application " | TOPICAL_OINTMENT | Freq: Every day | CUTANEOUS | 0 refills | Status: DC

## 2020-10-10 NOTE — Patient Instructions (Signed)

## 2020-10-10 NOTE — Progress Notes (Signed)
   Follow-Up Visit   Subjective  Joshua Lane is a 75 y.o. male who presents for the following: Cyst (Vs other of sacral area - excise today).  Accompanied by wife  The following portions of the chart were reviewed this encounter and updated as appropriate:   Tobacco  Allergies  Meds  Problems  Med Hx  Surg Hx  Fam Hx      Review of Systems:  No other skin or systemic complaints except as noted in HPI or Assessment and Plan.  Objective  Well appearing patient in no apparent distress; mood and affect are within normal limits.  A focused examination was performed including back. Relevant physical exam findings are noted in the Assessment and Plan.  Objective  Sacral area: 1.5 cm cystic papule   Assessment & Plan  Neoplasm of uncertain behavior of skin Sacral area  Skin excision  Lesion length (cm):  1.5 Lesion width (cm):  1.5 Margin per side (cm):  0 Total excision diameter (cm):  1.5 Informed consent: discussed and consent obtained   Timeout: patient name, date of birth, surgical site, and procedure verified   Procedure prep:  Patient was prepped and draped in usual sterile fashion Prep type:  Isopropyl alcohol and povidone-iodine Anesthesia: the lesion was anesthetized in a standard fashion   Anesthetic:  1% lidocaine w/ epinephrine 1-100,000 buffered w/ 8.4% NaHCO3 Instrument used: #15 blade   Hemostasis achieved with: pressure   Hemostasis achieved with comment:  Electrocautery Outcome: patient tolerated procedure well with no complications   Post-procedure details: sterile dressing applied and wound care instructions given   Dressing type: bandage and pressure dressing (mupirocin)    Skin repair Complexity:  Complex Final length (cm):  2.7 Reason for type of repair: reduce tension to allow closure, reduce the risk of dehiscence, infection, and necrosis, reduce subcutaneous dead space and avoid a hematoma, allow closure of the large defect, preserve normal  anatomy, preserve normal anatomical and functional relationships and enhance both functionality and cosmetic results   Undermining: area extensively undermined   Undermining comment:  Undermining defect 1.5 cm Subcutaneous layers (deep stitches):  Suture size:  3-0 Suture type: Vicryl (polyglactin 910)   Subcutaneous suture technique: inverted dermal. Fine/surface layer approximation (top stitches):  Suture size:  3-0 Suture type: nylon   Stitches: simple running   Suture removal (days):  7 Hemostasis achieved with: suture and pressure Outcome: patient tolerated procedure well with no complications   Post-procedure details: sterile dressing applied and wound care instructions given   Dressing type: bandage and pressure dressing (mupirocin)    mupirocin ointment (BACTROBAN) 2 %  Specimen 1 - Surgical pathology Differential Diagnosis: Cyst vs other Check Margins: No 1.5 cm cystic papule  Return in about 8 days (around 10/18/2020) for suture removal.   I, Ashok Cordia, CMA, am acting as scribe for [name], MD .  Documentation: I have reviewed the above documentation for accuracy and completeness, and I agree with the above.  [name], MD

## 2020-10-11 ENCOUNTER — Telehealth: Payer: Self-pay

## 2020-10-11 NOTE — Telephone Encounter (Signed)
Lft message for patient to call if any problems after yesterdays surgery.Joshua Lane

## 2020-10-18 ENCOUNTER — Ambulatory Visit (INDEPENDENT_AMBULATORY_CARE_PROVIDER_SITE_OTHER): Payer: Medicare HMO | Admitting: Dermatology

## 2020-10-18 ENCOUNTER — Other Ambulatory Visit: Payer: Self-pay

## 2020-10-18 DIAGNOSIS — Z4802 Encounter for removal of sutures: Secondary | ICD-10-CM

## 2020-10-18 DIAGNOSIS — L720 Epidermal cyst: Secondary | ICD-10-CM

## 2020-10-18 DIAGNOSIS — C44319 Basal cell carcinoma of skin of other parts of face: Secondary | ICD-10-CM

## 2020-10-18 NOTE — Patient Instructions (Signed)

## 2020-10-18 NOTE — Progress Notes (Signed)
   Follow-Up Visit   Subjective  Joshua Lane is a 75 y.o. male who presents for the following: cyst bx proven (Sacral area, 1 wk post op, pt presents for suture removal) and BCC bx proven (L temple).  The following portions of the chart were reviewed this encounter and updated as appropriate:   Tobacco  Allergies  Meds  Problems  Med Hx  Surg Hx  Fam Hx     Review of Systems:  No other skin or systemic complaints except as noted in HPI or Assessment and Plan.  Objective  Well appearing patient in no apparent distress; mood and affect are within normal limits.  A focused examination was performed including face, sacral area. Relevant physical exam findings are noted in the Assessment and Plan.  Objective  Sacral area: Healing excision site  Objective  L temple: Pink bx site   Assessment & Plan  Epidermal cyst Sacral area  Bx proven  Encounter for Removal of Sutures - Incision site at the sacral area is clean, dry and intact - Wound cleansed, sutures removed, wound cleansed and steri strips applied.  - Discussed pathology results showing epidermal cyst  - Patient advised to keep steri-strips dry until they fall off. - Scars remodel for a full year. - Once steri-strips fall off, patient can apply over-the-counter silicone scar cream each night to help with scar remodeling if desired. - Patient advised to call with any concerns or if they notice any new or changing lesions.   Basal cell carcinoma (BCC) of skin of other part of face L temple  Bx proven Patient scheduled for [facility] with Dr. Lacinda Axon on 01/05/21  Return in about 5 months (around 03/20/2021) for TBSE, Hx of BCC.  I, [name], RMA, am acting as scribe for [name], MD .  Documentation: I have reviewed the above documentation for accuracy and completeness, and I agree with the above.  [name], MD

## 2020-10-22 ENCOUNTER — Encounter: Payer: Self-pay | Admitting: Dermatology

## 2021-03-21 ENCOUNTER — Other Ambulatory Visit: Payer: Self-pay

## 2021-03-21 ENCOUNTER — Ambulatory Visit: Payer: Medicare HMO | Admitting: Dermatology

## 2021-03-21 DIAGNOSIS — Z1283 Encounter for screening for malignant neoplasm of skin: Secondary | ICD-10-CM | POA: Diagnosis not present

## 2021-03-21 DIAGNOSIS — L219 Seborrheic dermatitis, unspecified: Secondary | ICD-10-CM

## 2021-03-21 DIAGNOSIS — L821 Other seborrheic keratosis: Secondary | ICD-10-CM

## 2021-03-21 DIAGNOSIS — Z85828 Personal history of other malignant neoplasm of skin: Secondary | ICD-10-CM

## 2021-03-21 DIAGNOSIS — D229 Melanocytic nevi, unspecified: Secondary | ICD-10-CM

## 2021-03-21 DIAGNOSIS — L820 Inflamed seborrheic keratosis: Secondary | ICD-10-CM

## 2021-03-21 DIAGNOSIS — L814 Other melanin hyperpigmentation: Secondary | ICD-10-CM

## 2021-03-21 DIAGNOSIS — L578 Other skin changes due to chronic exposure to nonionizing radiation: Secondary | ICD-10-CM | POA: Diagnosis not present

## 2021-03-21 DIAGNOSIS — D1800 Hemangioma unspecified site: Secondary | ICD-10-CM

## 2021-03-21 MED ORDER — KETOCONAZOLE 2 % EX SHAM: 1.0000 "application " | MEDICATED_SHAMPOO | CUTANEOUS | 6 refills | Status: DC

## 2021-03-21 MED ORDER — HYDROCORTISONE 2.5 % EX LOTN: TOPICAL_LOTION | CUTANEOUS | 6 refills | Status: DC

## 2021-03-21 NOTE — Patient Instructions (Signed)

## 2021-03-21 NOTE — Progress Notes (Signed)
Follow-Up Visit   Subjective  Joshua Lane is a 75 y.o. male who presents for the following: Annual Exam (Hx of BCC - L temple tx with MOHS by Dr. Lacinda Axon). The patient presents for Total-Body Skin Exam (TBSE) for skin cancer screening and mole check.  The following portions of the chart were reviewed this encounter and updated as appropriate:   Tobacco  Allergies  Meds  Problems  Med Hx  Surg Hx  Fam Hx     Review of Systems:  No other skin or systemic complaints except as noted in HPI or Assessment and Plan.  Objective  Well appearing patient in no apparent distress; mood and affect are within normal limits.  A full examination was performed including scalp, head, eyes, ears, nose, lips, neck, chest, axillae, abdomen, back, buttocks, bilateral upper extremities, bilateral lower extremities, hands, feet, fingers, toes, fingernails, and toenails. All findings within normal limits unless otherwise noted below.  L temple Clear.  R lat canthus x 1 Erythematous keratotic or waxy stuck-on papule or plaque.   Face and scalp Pink patches with greasy scale.    Assessment & Plan  History of basal cell carcinoma (BCC) L temple  Clear. Observe for recurrence. Call clinic for new or changing lesions.  Recommend regular skin exams, daily broad-spectrum spf 30+ sunscreen use, and photoprotection.     Inflamed seborrheic keratosis R lat canthus x 1  Destruction of lesion - R lat canthus x 1 Complexity: simple   Destruction method: cryotherapy   Informed consent: discussed and consent obtained   Timeout:  patient name, date of birth, surgical site, and procedure verified Lesion destroyed using liquid nitrogen: Yes   Region frozen until ice ball extended beyond lesion: Yes   Outcome: patient tolerated procedure well with no complications   Post-procedure details: wound care instructions given    Seborrheic dermatitis Face and scalp  Seborrheic Dermatitis  -  is a chronic  persistent rash characterized by pinkness and scaling most commonly of the mid face but also can occur on the scalp (dandruff), ears; mid chest, mid back and groin.  It tends to be exacerbated by stress and cooler weather.  People who have neurologic disease may experience new onset or exacerbation of existing seborrheic dermatitis.  The condition is not curable but treatable and can be controlled.  Continue Ketoconazole 2% shampoo to the face and scalp 3d/wk and HC 2.5% lotion 3d/wk PRN.   Related Medications ketoconazole (NIZORAL) 2 % shampoo Apply 1 application topically 3 (three) times a week. Wash face and scalp  hydrocortisone 2.5 % lotion Apply topically as directed. Apply to face three times per week- Monday, Wednesday, Friday  Skin cancer screening  Lentigines - Scattered tan macules - Due to sun exposure - Benign-appearing, observe - Recommend daily broad spectrum sunscreen SPF 30+ to sun-exposed areas, reapply every 2 hours as needed. - Call for any changes  Seborrheic Keratoses - Stuck-on, waxy, tan-brown papules and/or plaques  - Benign-appearing - Discussed benign etiology and prognosis. - Observe - Call for any changes  Melanocytic Nevi - Tan-brown and/or pink-flesh-colored symmetric macules and papules - Benign appearing on exam today - Observation - Call clinic for new or changing moles - Recommend daily use of broad spectrum spf 30+ sunscreen to sun-exposed areas.   Hemangiomas - Red papules - Discussed benign nature - Observe - Call for any changes  Actinic Damage - Chronic condition, secondary to cumulative UV/sun exposure - diffuse scaly erythematous macules with  underlying dyspigmentation - Recommend daily broad spectrum sunscreen SPF 30+ to sun-exposed areas, reapply every 2 hours as needed.  - Staying in the shade or wearing Mcdiarmid sleeves, sun glasses (UVA+UVB protection) and wide brim hats (4-inch brim around the entire circumference of the hat) are  also recommended for sun protection.  - Call for new or changing lesions.  Skin cancer screening performed today.  Return in about 1 year (around 03/21/2022) for TBSE.  Joshua Lane, CMA, am acting as scribe for [name], MD . Documentation: I have reviewed the above documentation for accuracy and completeness, and I agree with the above.  [name], MD

## 2021-03-23 ENCOUNTER — Encounter: Payer: Self-pay | Admitting: Dermatology

## 2021-05-23 IMAGING — CT CT L SPINE W/O CM
3 of 9 series · 14 of 33 positions shown, 17 images · non-contrast
Comparison: None.

CLINICAL DATA: Fall.

EXAM:
CT LUMBAR SPINE WITHOUT CONTRAST
TECHNIQUE: Multidetector CT imaging of the lumbar spine was performed without
intravenous contrast administration. Multiplanar CT image
reconstructions were also generated.

[Series 503: lspine ax · axial · 0.46mm/px · z∈[-606,-452]mm · 6 of 109 slices shown, 8 images]
[im 16/109  soft-tissue]
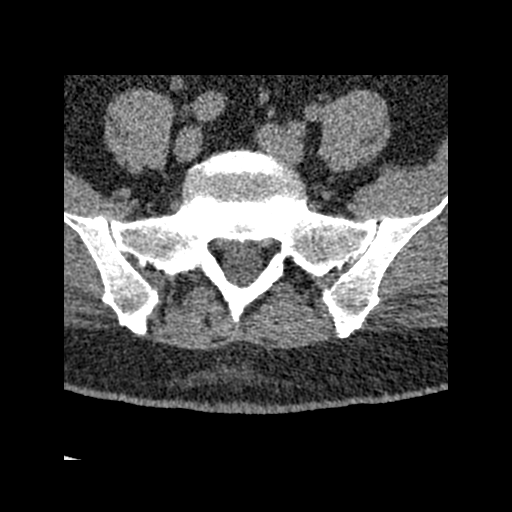
[im 16/109  bone]
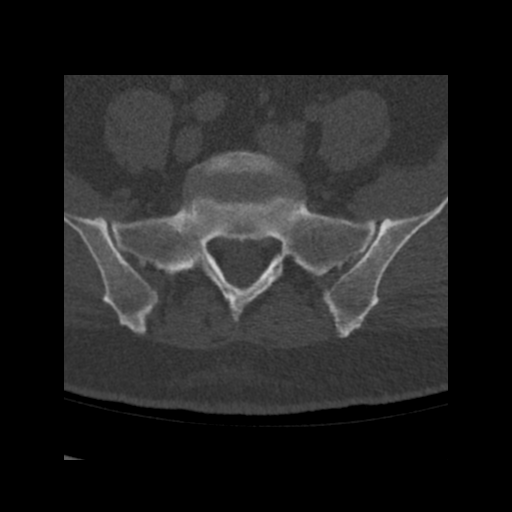
[im 31/109  bone]
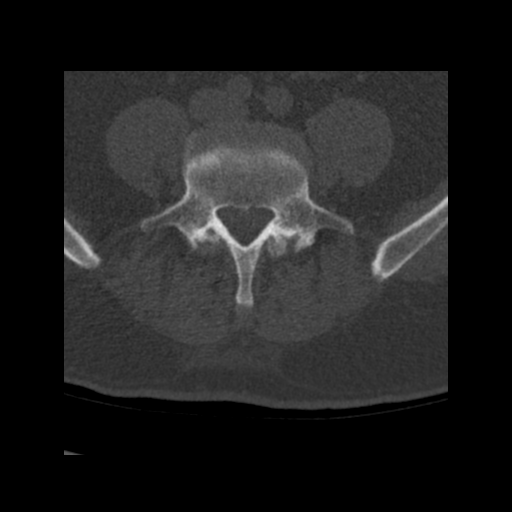
[im 47/109  bone]
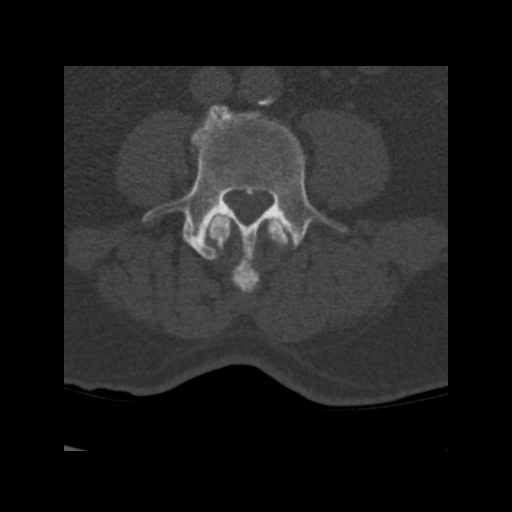
[im 62/109  bone]
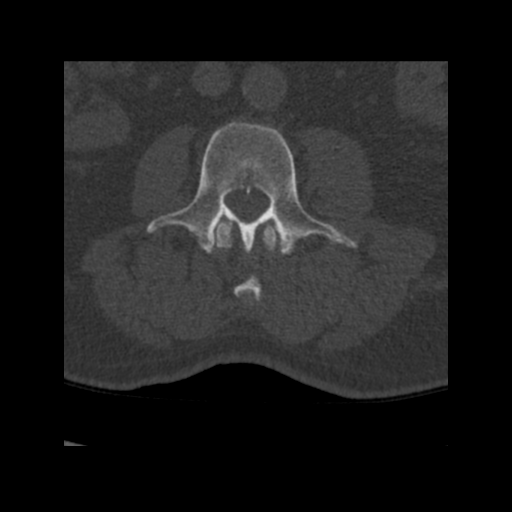
[im 78/109  soft-tissue]
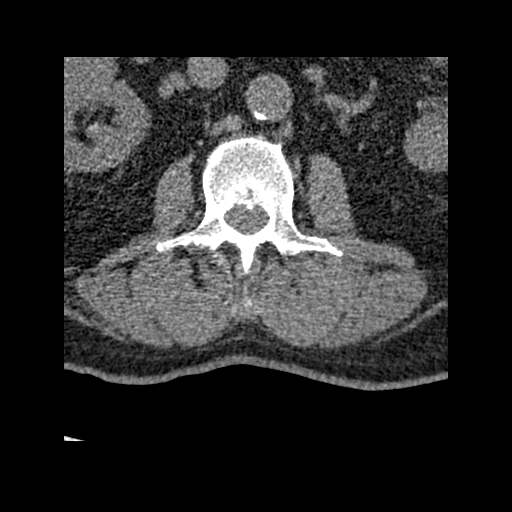
[im 78/109  bone]
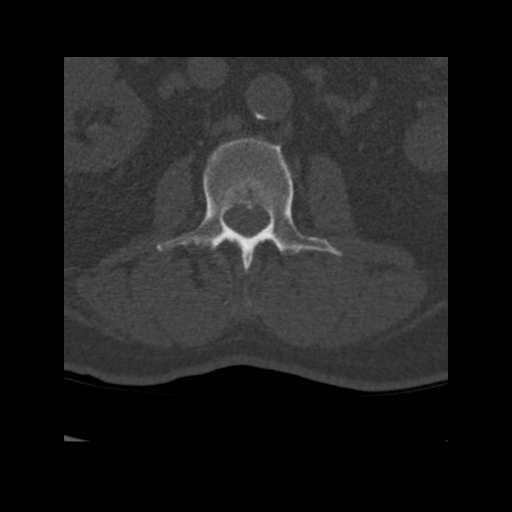
[im 93/109  bone]
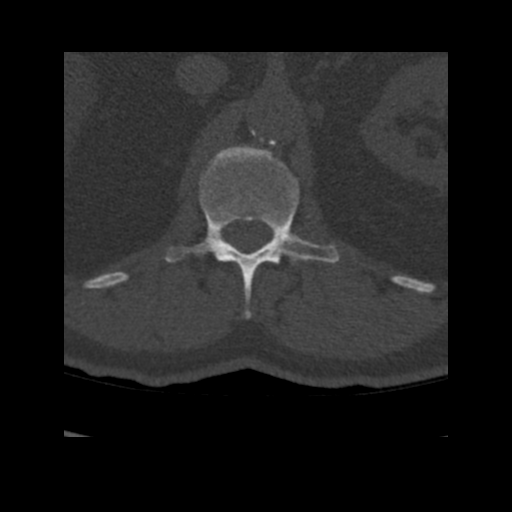

[Series 511: lspine cor · coronal · 0.46mm/px · 3 of 51 slices shown]
[im 11/51  bone]
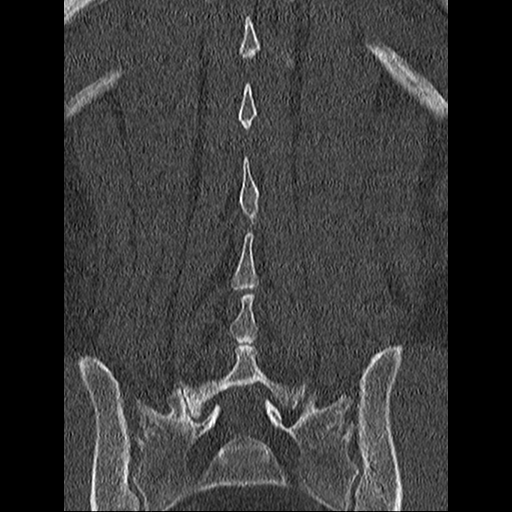
[im 21/51  bone]
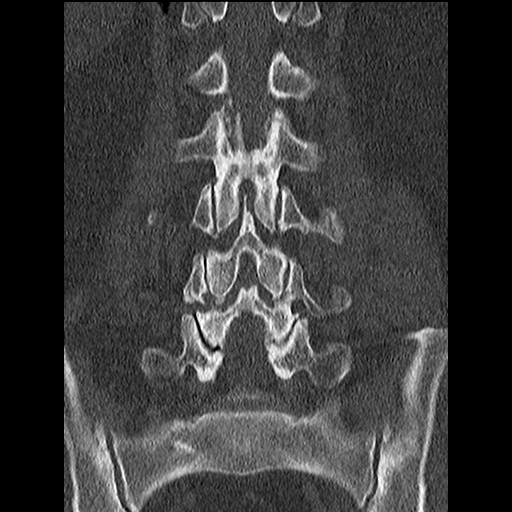
[im 31/51  bone]
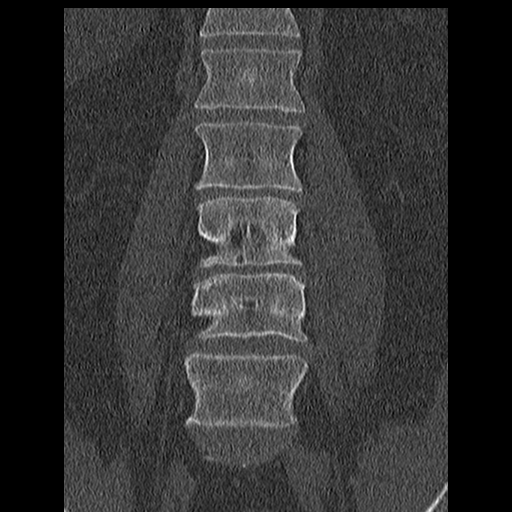

[Series 512: lspine sag · sagittal · 0.46mm/px · 5 of 49 slices shown, 6 images]
[im 17/49  bone]
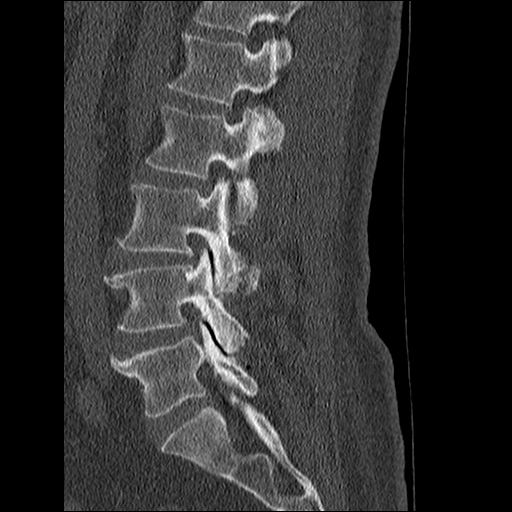
[im 21/49  bone]
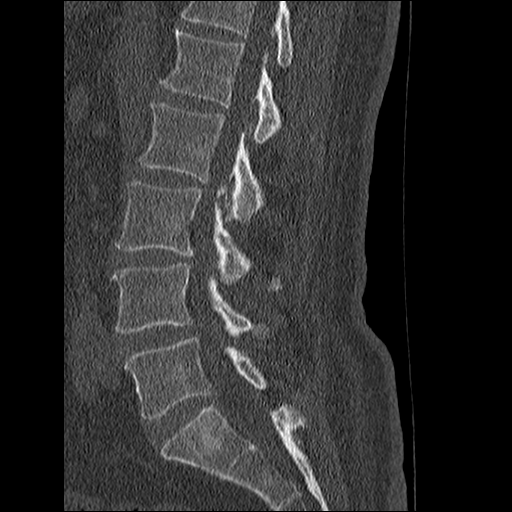
[im 25/49  soft-tissue]
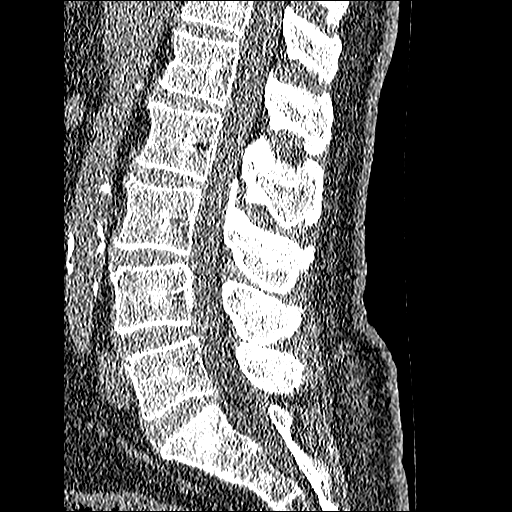
[im 25/49  bone]
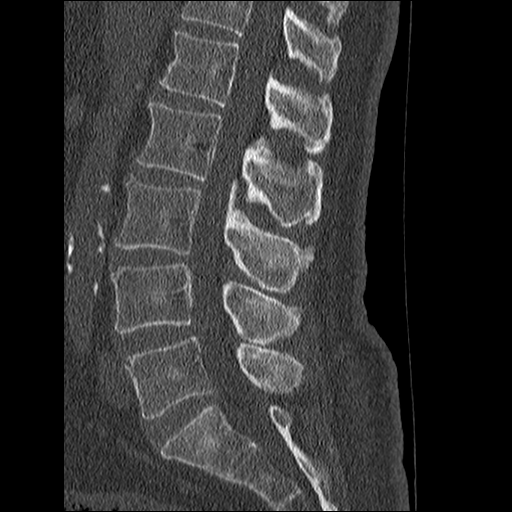
[im 29/49  bone]
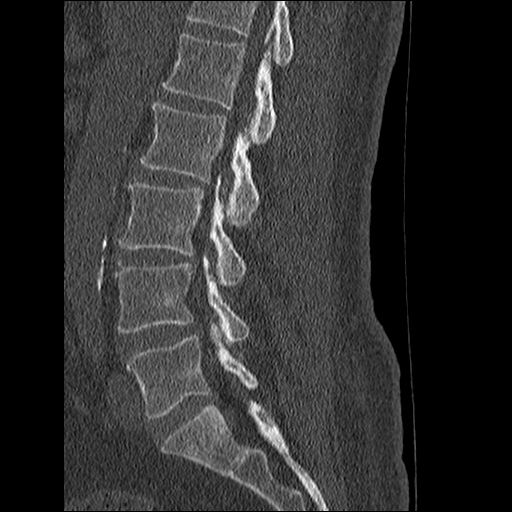
[im 33/49  bone]
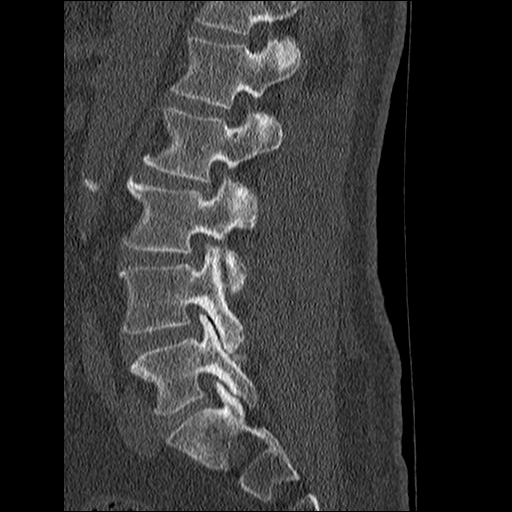

[14 of 33 positions shown; findings below may reference images not displayed]

FINDINGS: Segmentation: Normal

Alignment: Normal

Vertebrae: Negative for fracture

Paraspinal and other soft tissues: Mild atherosclerotic aorta
without aneurysm. No paraspinous mass or soft tissue swelling

Disc levels: L1-2: Bilateral facet degeneration. Mild spinal
stenosis

L2-3: Moderate to advanced facet degeneration. Moderate spinal
stenosis. Moderate subarticular stenosis bilaterally

L3-4: Moderate to advanced facet degeneration. Mild spinal stenosis.
Moderate subarticular stenosis bilaterally.

L4-5: Diffuse disc bulging. Moderate to advanced facet degeneration.
The mild to moderate spinal stenosis. Moderate subarticular stenosis
bilaterally

L5-S1: Mild disc and facet degeneration.  Negative for stenosis.
IMPRESSION: Negative for lumbar fracture

Multilevel degenerative change as above.

## 2021-05-23 IMAGING — CT CT T SPINE W/O CM
3 of 4 series · 16 of 33 positions shown, 19 images · non-contrast
Comparison: None.

CLINICAL DATA: Fall

EXAM:
CT THORACIC SPINE WITHOUT CONTRAST
TECHNIQUE: Multidetector CT images of the thoracic were obtained using the
standard protocol without intravenous contrast.

[Series 503: tspine bone thins · axial · 0.35mm/px · z∈[-419,-180]mm · 8 of 599 slices shown, 10 images]
[im 60/599  soft-tissue]
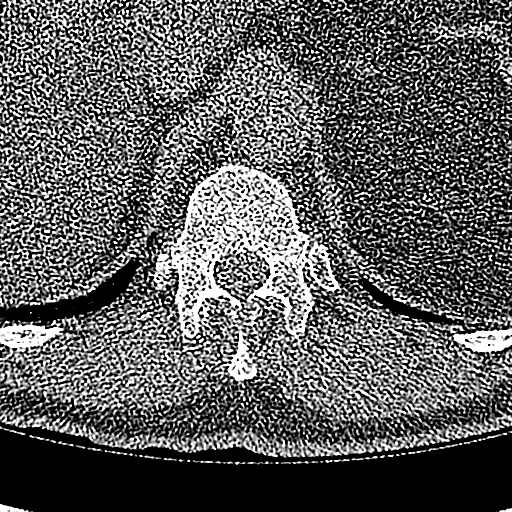
[im 60/599  bone]
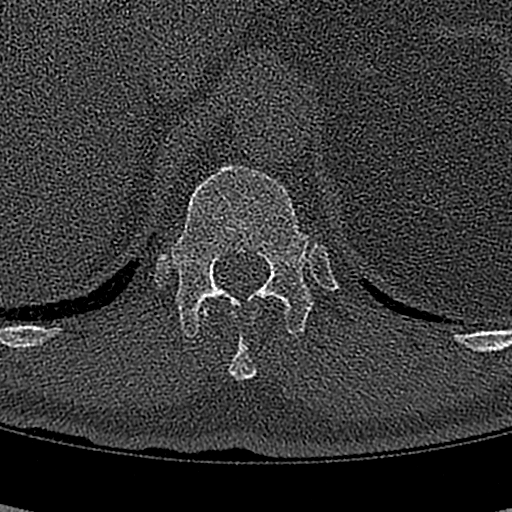
[im 120/599  bone]
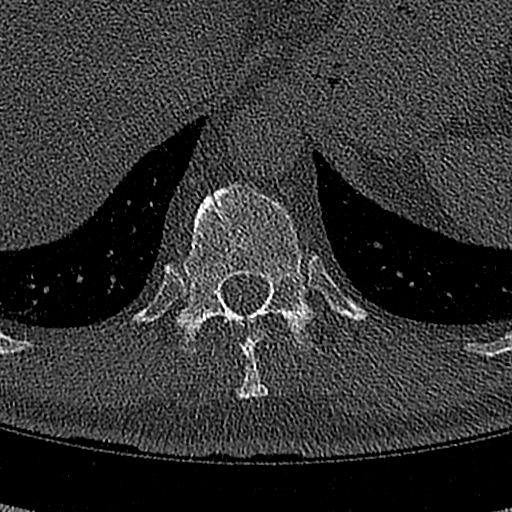
[im 180/599  bone]
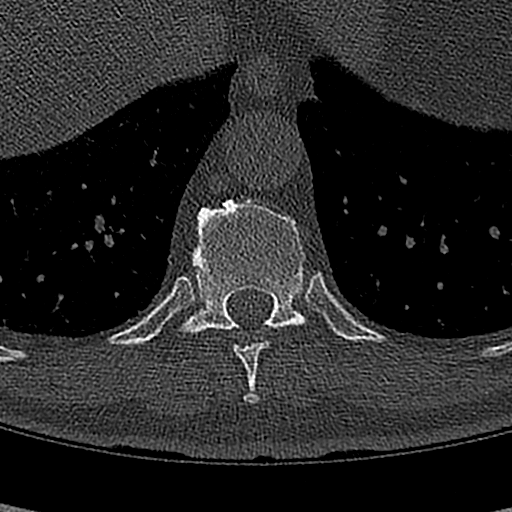
[im 240/599  bone]
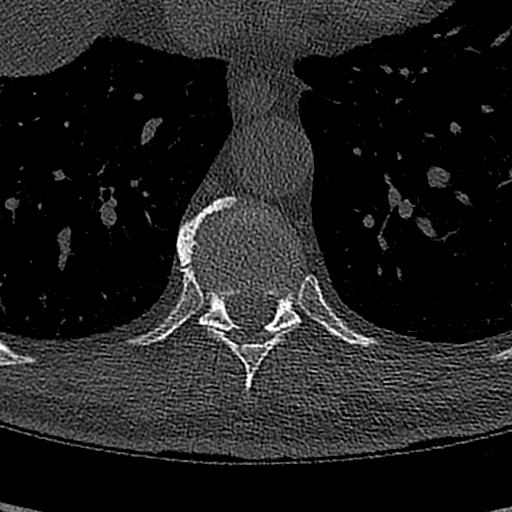
[im 359/599  soft-tissue]
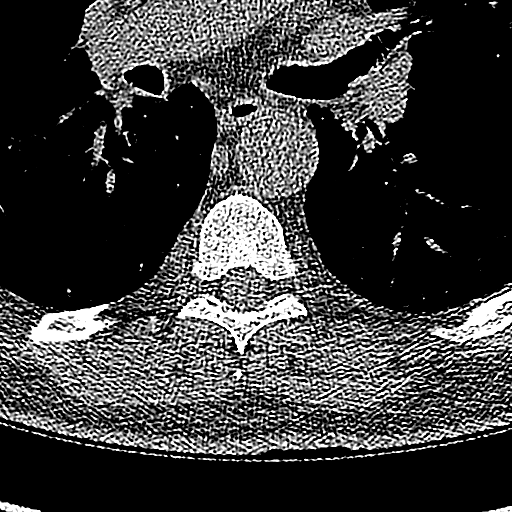
[im 359/599  bone]
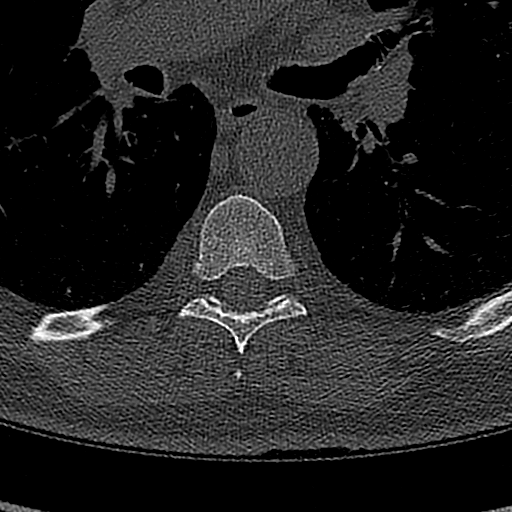
[im 419/599  bone]
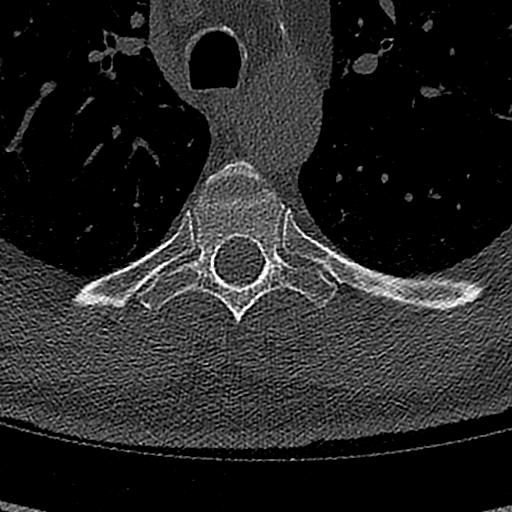
[im 479/599  bone]
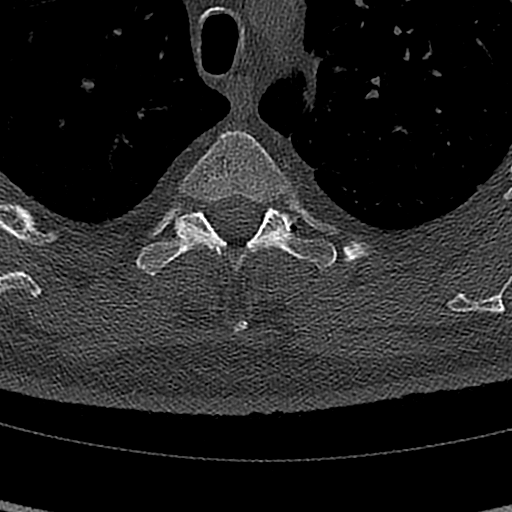
[im 539/599  bone]
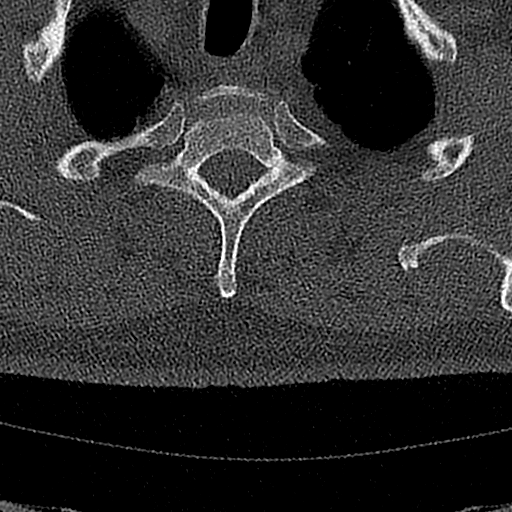

[Series 504: tspine sag · sagittal · 0.59mm/px · 5 of 46 slices shown, 6 images]
[im 16/46  bone]
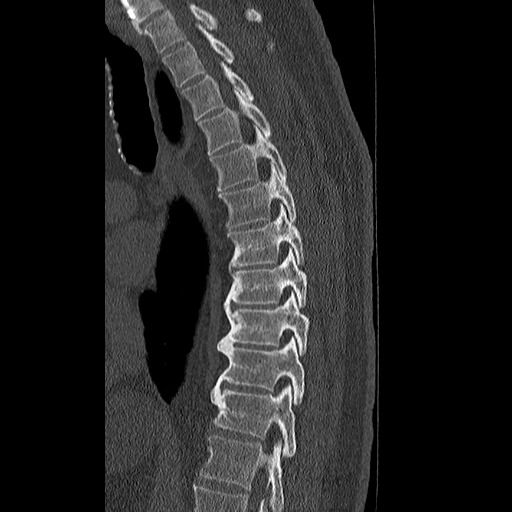
[im 19/46  bone]
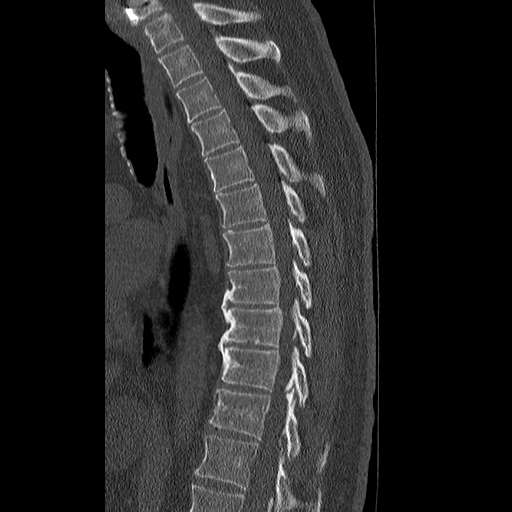
[im 23/46  soft-tissue]
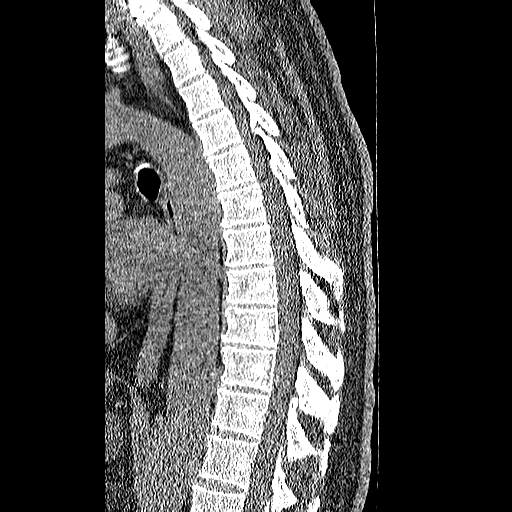
[im 23/46  bone]
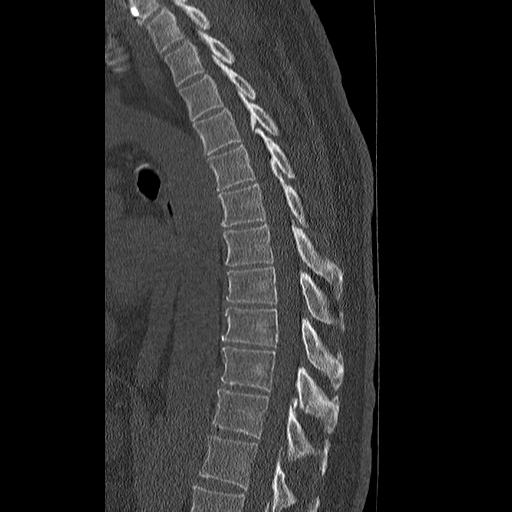
[im 27/46  bone]
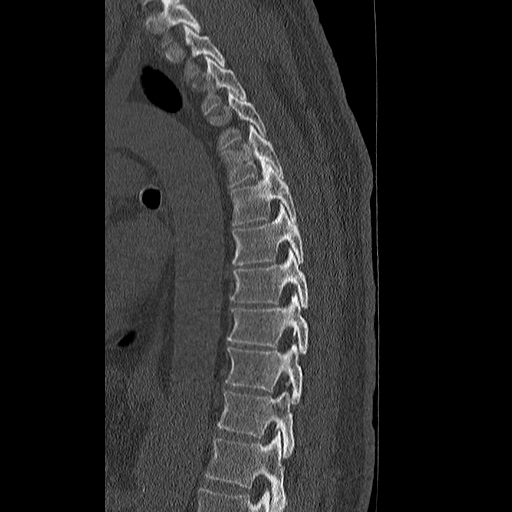
[im 31/46  bone]
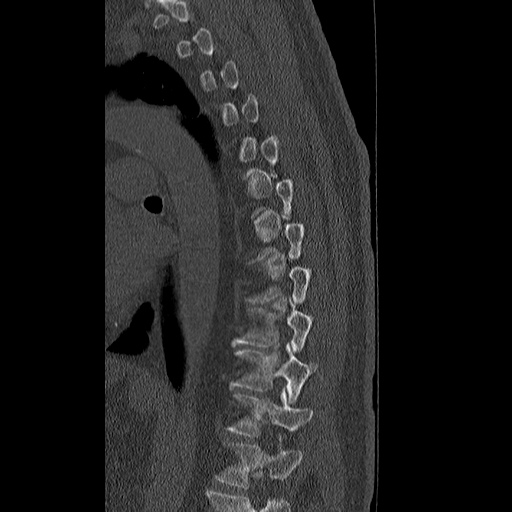

[Series 505: tspine cor · coronal · 0.59mm/px · 3 of 69 slices shown]
[im 14/69  bone]
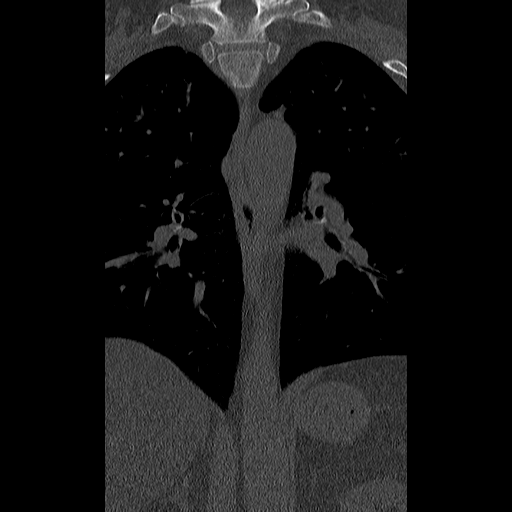
[im 28/69  bone]
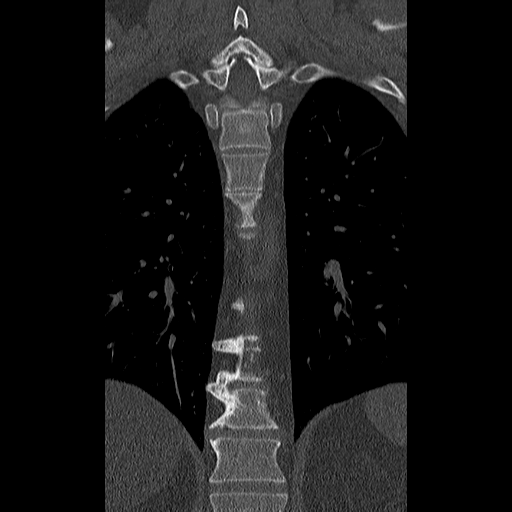
[im 41/69  bone]
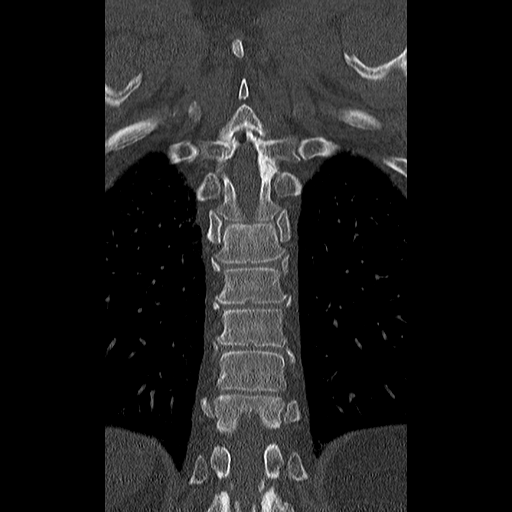

[16 of 33 positions shown; findings below may reference images not displayed]

FINDINGS: Alignment: Normal

Vertebrae: Negative for fracture.  Small bone island right lamina C4

Paraspinal and other soft tissues: Chest CT today reported
separately

Disc levels: Mild disc degeneration with mild disc space narrowing.
Mild right anterior osteophyte formation T7 through T11.
IMPRESSION: Negative for thoracic spine fracture.

## 2022-01-15 NOTE — Progress Notes (Unsigned)
Cardiology Office Note  Date:  01/16/2022   ID:  Joshua Lane, DOB Mar 14, 1946, MRN [id number]  PCP:  Albina Billet, MD   Chief Complaint  Patient presents with   12 month follow up     "Doing well." Medications reviewed by the patient verbally.     HPI:  Joshua Lane is a 76 year old gentleman with history of  hypertension,  smoking stopped >10 years ago, dip type 2 diabetes,  chronic neck pain,  status post surgery of the cervical spine,  upper extremity neuropathy,  presenting for follow-up of his shortness of breath, chest tightness on exertion.  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  Prior testing reviewed stress test 05/2015  no significant ischemia  EKG personally reviewed by myself on todays visit Nsr rate 79 bpm /no ST or T wave changes  Prior lab work Total chol 135,LDL 72 HAB1c 7.1 In 04/2019   PMH:   has a past medical history of Basal cell carcinoma (09/20/2020), Diabetes mellitus without complication ([place]), Hypertension, Smoker (09/10/2016), and Type 2 diabetes mellitus with hyperosmolarity without coma, without [place]-term current use of insulin ([place]) (05/15/2015).  PSH:    Past Surgical History:  Procedure Laterality Date   APPENDECTOMY     BROW LIFT Bilateral 08/03/2020   Procedure: BLEPHAROPLASTY UPPER EYELID WIT EXCESS SKIN DIABETIC;  Surgeon: Karle Starch, MD;  Location: [facility];  Service: Ophthalmology;  Laterality: Bilateral;  Diabetic   NECK SURGERY      Current Outpatient Medications  Medication Sig Dispense Refill   amLODipine (NORVASC) 10 MG tablet Take 10 mg by mouth daily.  4   aspirin EC 81 MG  tablet Take 81 mg by mouth daily.     empagliflozin (JARDIANCE) 25 MG TABS tablet Jardiance 25 mg tablet     glipiZIDE (GLUCOTROL XL) 2.5 MG 24 hr tablet Take 1 tablet by mouth daily.     ketoconazole (NIZORAL) 2 % shampoo Apply 1 application topically 3 (three) times a week. Wash face and scalp 120 mL 6   losartan-hydrochlorothiazide (HYZAAR) 100-25 MG tablet Take 1 tablet by mouth daily.  5   meloxicam (MOBIC) 7.5 MG tablet TAKE 1 TABLET EVERY DAY BY ORAL ROUTE AS NEEDED.     metFORMIN (GLUCOPHAGE) 500 MG tablet Take 1,000 mg by mouth 2 (two) times daily.  5   omeprazole (PRILOSEC) 20 MG capsule Take 20 mg by mouth daily.     pravastatin (PRAVACHOL) 20 MG tablet Take 20 mg by mouth daily.     traMADol (ULTRAM) 50 MG tablet Take 1 every 4-6 hours as needed for pain not controlled by Tylenol 6 tablet 0   erythromycin ophthalmic ointment Apply to sutures 4 times a day for 10-12 days.  Discontinue if allergy develops and call our office (Patient not taking: Reported on 01/16/2022) 3.5 g 2   glimepiride (AMARYL) 1 MG tablet Take 1 mg by mouth daily with breakfast. (Patient not taking: Reported on 01/16/2022)     hydrocortisone 2.5 % lotion Apply topically as directed. Apply to face three times per week- Monday, Wednesday, Friday (Patient not taking: Reported on 01/16/2022) 118 mL 6  ibuprofen (ADVIL) 200 MG tablet Take by mouth. (Patient not taking: Reported on 01/16/2022)     mupirocin ointment (BACTROBAN) 2 % Apply 1 application topically daily. With dressing changes (Patient not taking: Reported on 03/21/2021) 22 g 0   sulfamethoxazole-trimethoprim (BACTRIM DS) 800-160 MG tablet Take 1 tablet by mouth 2 (two) times daily. (Patient not taking: Reported on 07/25/2020) 20 tablet 0   No current facility-administered medications for this visit.     Allergies:   Penicillins   Social History:  The patient  reports that he quit smoking about 15 years ago. His smoking use included cigarettes. He has a 55.00  pack-year smoking history. His smokeless tobacco use includes snuff. He reports current alcohol use. He reports that he does not use drugs.   Family History:   Family history is unknown by patient.    Review of Systems: Review of Systems  Constitutional: Negative.   Respiratory: Negative.    Cardiovascular: Negative.   Gastrointestinal: Negative.   Musculoskeletal: Negative.   Neurological: Negative.   Psychiatric/Behavioral: Negative.    All other systems reviewed and are negative.   PHYSICAL EXAM: VS:  BP 120/64 (BP Location: Left Arm, Patient Position: Sitting, Cuff Size: Normal)   Pulse 79   Ht '5\' 8"'$  (1.727 m)   Wt 206 lb (93.4 kg)   SpO2 98%   BMI 31.32 kg/m  , BMI Body mass index is 31.32 kg/m. Constitutional:  oriented to person, place, and time. No distress.  HENT:  Head: Grossly normal Eyes:  no discharge. No scleral icterus.  Neck: No JVD, no carotid bruits  Cardiovascular: Regular rate and rhythm, no murmurs appreciated Pulmonary/Chest: Clear to auscultation bilaterally, no wheezes or rails Abdominal: Soft.  no distension.  no tenderness.  Musculoskeletal: Normal range of motion Neurological:  normal muscle tone. Coordination normal. No atrophy Skin: Skin warm and dry Psychiatric: normal affect, pleasant  Recent Labs: No results found for requested labs within last 365 days.    Lipid Panel No results found for: "CHOL", "HDL", "LDLCALC", "TRIG"    Wt Readings from Last 3 Encounters:  01/16/22 206 lb (93.4 kg)  08/03/20 207 lb (93.9 kg)  07/18/20 219 lb 9.3 oz (99.6 kg)     ASSESSMENT AND PLAN:  Essential hypertension - Plan: EKG 12-Lead Blood pressure is well controlled on today's visit. No changes made to the medications.  Type 2 diabetes mellitus with hyperosmolarity without coma, without [place]-term current use of insulin (HCC) - HBA1C typically low 7 range, Managed by Dr. Hall Busing We have encouraged continued exercise, careful diet management in an  effort to lose weight.  Smoker -  Stopped smoking 10 years ago Chronic stable SOB, no change in symptoms Exercise program recommended  Hyperlipidemia On statin, prior numbers controlled New numbers pending    Total encounter time more than 30 minutes  Greater than 50% was spent in counseling and coordination of care with the patient   Orders Placed This Encounter  Procedures   EKG 12-Lead     Signed, Esmond Plants, M.D., Ph.D. 01/16/2022  [place], [place]

## 2022-01-16 ENCOUNTER — Ambulatory Visit: Payer: Medicare HMO | Admitting: Cardiovascular Disease

## 2022-01-16 ENCOUNTER — Encounter: Payer: Self-pay | Admitting: Cardiovascular Disease

## 2022-01-16 VITALS — BP 120/64 | HR 79 | Ht 68.0 in | Wt 206.0 lb

## 2022-01-16 DIAGNOSIS — I10 Essential (primary) hypertension: Secondary | ICD-10-CM

## 2022-01-16 DIAGNOSIS — E1169 Type 2 diabetes mellitus with other specified complication: Secondary | ICD-10-CM | POA: Diagnosis not present

## 2022-01-16 DIAGNOSIS — E782 Mixed hyperlipidemia: Secondary | ICD-10-CM

## 2022-01-16 DIAGNOSIS — Z87891 Personal history of nicotine dependence: Secondary | ICD-10-CM | POA: Diagnosis not present

## 2022-01-16 DIAGNOSIS — E669 Obesity, unspecified: Secondary | ICD-10-CM | POA: Diagnosis not present

## 2022-01-16 MED ORDER — AMLODIPINE BESYLATE 10 MG PO TABS: 10.0000 mg | ORAL_TABLET | Freq: Every day | ORAL | 3 refills | Status: DC

## 2022-01-16 MED ORDER — LOSARTAN POTASSIUM-HCTZ 100-25 MG PO TABS: 1.0000 | ORAL_TABLET | Freq: Every day | ORAL | 3 refills | Status: DC

## 2022-01-16 MED ORDER — PRAVASTATIN SODIUM 20 MG PO TABS: 20.0000 mg | ORAL_TABLET | Freq: Every day | ORAL | 3 refills | Status: DC

## 2022-01-16 MED ORDER — EMPAGLIFLOZIN 25 MG PO TABS: 25.0000 mg | ORAL_TABLET | Freq: Every day | ORAL | 3 refills | Status: DC

## 2022-01-16 NOTE — Patient Instructions (Signed)
Medication Instructions:  No changes  If you need a refill on your cardiac medications before your next appointment, please call your pharmacy.   Lab work: No new labs needed  Testing/Procedures: No new testing needed  Follow-Up: At [facility], you and your health needs are our priority.  As part of our continuing mission to provide you with exceptional heart care, we have created designated Provider Care Teams.  These Care Teams include your primary Cardiologist (physician) and Advanced Practice Providers (APPs -  Physician Assistants and Nurse Practitioners) who all work together to provide you with the care you need, when you need it.  You will need a follow up appointment in 12 months  Providers on your designated Care Team:   Christopher Berge, NP Ryan Dunn, PA-C Cadence Furth, PA-C  COVID-19 Vaccine Information can be found at: [email or web address] For questions related to vaccine distribution or appointments, please email [email or web address] or call [phone].   

## 2022-01-23 ENCOUNTER — Encounter: Payer: Self-pay | Admitting: Cardiovascular Disease

## 2022-03-27 ENCOUNTER — Ambulatory Visit: Payer: Medicare HMO | Admitting: Dermatology

## 2022-03-27 ENCOUNTER — Encounter: Payer: Self-pay | Admitting: Dermatology

## 2022-03-27 DIAGNOSIS — L219 Seborrheic dermatitis, unspecified: Secondary | ICD-10-CM | POA: Diagnosis not present

## 2022-03-27 DIAGNOSIS — Z79899 Other long term (current) drug therapy: Secondary | ICD-10-CM

## 2022-03-27 DIAGNOSIS — Z85828 Personal history of other malignant neoplasm of skin: Secondary | ICD-10-CM

## 2022-03-27 DIAGNOSIS — L578 Other skin changes due to chronic exposure to nonionizing radiation: Secondary | ICD-10-CM

## 2022-03-27 DIAGNOSIS — Z1283 Encounter for screening for malignant neoplasm of skin: Secondary | ICD-10-CM

## 2022-03-27 DIAGNOSIS — D229 Melanocytic nevi, unspecified: Secondary | ICD-10-CM

## 2022-03-27 DIAGNOSIS — L821 Other seborrheic keratosis: Secondary | ICD-10-CM

## 2022-03-27 DIAGNOSIS — L814 Other melanin hyperpigmentation: Secondary | ICD-10-CM

## 2022-03-27 DIAGNOSIS — D1801 Hemangioma of skin and subcutaneous tissue: Secondary | ICD-10-CM

## 2022-03-27 DIAGNOSIS — L570 Actinic keratosis: Secondary | ICD-10-CM | POA: Diagnosis not present

## 2022-03-27 MED ORDER — HYDROCORTISONE 2.5 % EX LOTN: TOPICAL_LOTION | CUTANEOUS | 6 refills | Status: DC

## 2022-03-27 MED ORDER — KETOCONAZOLE 2 % EX SHAM: 1.0000 | MEDICATED_SHAMPOO | CUTANEOUS | 6 refills | Status: DC

## 2022-03-27 MED ORDER — PIMECROLIMUS 1 % EX CREA: TOPICAL_CREAM | Freq: Every day | CUTANEOUS | 11 refills | Status: DC

## 2022-03-27 NOTE — Patient Instructions (Addendum)
Cryotherapy Aftercare  Wash gently with soap and water everyday.   Apply Vaseline and Band-Aid daily until healed.     Due to recent changes in healthcare laws, you may see results of your pathology and/or laboratory studies on MyChart before the doctors have had a chance to review them. We understand that in some cases there may be results that are confusing or concerning to you. Please understand that not all results are received at the same time and often the doctors may need to interpret multiple results in order to provide you with the best plan of care or course of treatment. Therefore, we ask that you please give us 2 business days to thoroughly review all your results before contacting the office for clarification. Should we see a critical lab result, you will be contacted sooner.   If You Need Anything After Your Visit  If you have any questions or concerns for your doctor, please call our main line at [phone] and press option 4 to reach your doctor's medical assistant. If no one answers, please leave a voicemail as directed and we will return your call as soon as possible. Messages left after 4 pm will be answered the following business day.   You may also send us a message via MyChart. We typically respond to MyChart messages within 1-2 business days.  For prescription refills, please ask your pharmacy to contact our office. Our fax number is [phone].  If you have an urgent issue when the clinic is closed that cannot wait until the next business day, you can page your doctor at the number below.    Please note that while we do our best to be available for urgent issues outside of office hours, we are not available 24/7.   If you have an urgent issue and are unable to reach us, you may choose to seek medical care at your doctor's office, retail clinic, urgent care center, or emergency room.  If you have a medical emergency, please immediately call 911 or go to the  emergency department.  Pager Numbers  - Dr. Kowalski: [phone]  - Dr. Moye: [phone]  - Dr. Stewart: [phone]  In the event of inclement weather, please call our main line at [phone] for an update on the status of any delays or closures.  Dermatology Medication Tips: Please keep the boxes that topical medications come in in order to help keep track of the instructions about where and how to use these. Pharmacies typically print the medication instructions only on the boxes and not directly on the medication tubes.   If your medication is too expensive, please contact our office at [phone] option 4 or send us a message through MyChart.   We are unable to tell what your co-pay for medications will be in advance as this is different depending on your insurance coverage. However, we may be able to find a substitute medication at lower cost or fill out paperwork to get insurance to cover a needed medication.   If a prior authorization is required to get your medication covered by your insurance company, please allow us 1-2 business days to complete this process.  Drug prices often vary depending on where the prescription is filled and some pharmacies may offer cheaper prices.  The website www.goodrx.com contains coupons for medications through different pharmacies. The prices here do not account for what the cost may be with help from insurance (it may be cheaper with your insurance), but the website can   give you the price if you did not use any insurance.  - You can print the associated coupon and take it with your prescription to the pharmacy.  - You may also stop by our office during regular business hours and pick up a GoodRx coupon card.  - If you need your prescription sent electronically to a different pharmacy, notify our office through  MyChart or by phone at [phone] option 4.     Si Usted Necesita Algo Despus de Su Visita  Tambin puede  enviarnos un mensaje a travs de MyChart. Por lo general respondemos a los mensajes de MyChart en el transcurso de 1 a 2 das hbiles.  Para renovar recetas, por favor pida a su farmacia que se ponga en contacto con nuestra oficina. Nuestro nmero de fax es el [phone].  Si tiene un asunto urgente cuando la clnica est cerrada y que no puede esperar hasta el siguiente da hbil, puede llamar/localizar a su doctor(a) al nmero que aparece a continuacin.   Por favor, tenga en cuenta que aunque hacemos todo lo posible para estar disponibles para asuntos urgentes fuera del horario de oficina, no estamos disponibles las 24 horas del da, los 7 das de la semana.   Si tiene un problema urgente y no puede comunicarse con nosotros, puede optar por buscar atencin mdica  en el consultorio de su doctor(a), en una clnica privada, en un centro de atencin urgente o en una sala de emergencias.  Si tiene una emergencia mdica, por favor llame inmediatamente al 911 o vaya a la sala de emergencias.  Nmeros de bper  - Dr. Kowalski: [phone]  - Dra. Moye: [phone]  - Dra. Stewart: [phone]  En caso de inclemencias del tiempo, por favor llame a nuestra lnea principal al [phone] para una actualizacin sobre el estado de cualquier retraso o cierre.  Consejos para la medicacin en dermatologa: Por favor, guarde las cajas en las que vienen los medicamentos de uso tpico para ayudarle a seguir las instrucciones sobre dnde y cmo usarlos. Las farmacias generalmente imprimen las instrucciones del medicamento slo en las cajas y no directamente en los tubos del medicamento.   Si su medicamento es muy caro, por favor, pngase en contacto con nuestra oficina llamando al [phone] y presione la opcin 4 o envenos un mensaje a travs de MyChart.   No podemos decirle cul ser su copago por los medicamentos por adelantado ya que esto es diferente dependiendo de la cobertura de su seguro.  Sin embargo, es posible que podamos encontrar un medicamento sustituto a menor costo o llenar un formulario para que el seguro cubra el medicamento que se considera necesario.   Si se requiere una autorizacin previa para que su compaa de seguros cubra su medicamento, por favor permtanos de 1 a 2 das hbiles para completar este proceso.  Los precios de los medicamentos varan con frecuencia dependiendo del lugar de dnde se surte la receta y alguna farmacias pueden ofrecer precios ms baratos.  El sitio web www.goodrx.com tiene cupones para medicamentos de diferentes farmacias. Los precios aqu no tienen en cuenta lo que podra costar con la ayuda del seguro (puede ser ms barato con su seguro), pero el sitio web puede darle el precio si no utiliz ningn seguro.  - Puede imprimir el cupn correspondiente y llevarlo con su receta a la farmacia.  - Tambin puede pasar por nuestra oficina durante el horario de atencin regular y recoger una tarjeta de cupones de GoodRx.  -   Si necesita que su receta se enve electrnicamente a una farmacia diferente, informe a nuestra oficina a travs de MyChart de [facility] o por telfono llamando al [phone] y presione la opcin 4.  

## 2022-03-27 NOTE — Progress Notes (Signed)
Follow-Up Visit   Subjective  Joshua Lane is a 76 y.o. male who presents for the following: Total body skin exam (Hx of BCC L temple) and Seborrheic Dermatitis (Face, scalp, eyelids Ketoconazole 2% shampoo qd, HC 2.5% lotion prn). The patient presents for Total-Body Skin Exam (TBSE) for skin cancer screening and mole check.  The patient has spots, moles and lesions to be evaluated, some may be new or changing and the patient has concerns that these could be cancer.   The following portions of the chart were reviewed this encounter and updated as appropriate:   Tobacco  Allergies  Meds  Problems  Med Hx  Surg Hx  Fam Hx     Review of Systems:  No other skin or systemic complaints except as noted in HPI or Assessment and Plan.  Objective  Well appearing patient in no apparent distress; mood and affect are within normal limits.  A full examination was performed including scalp, head, eyes, ears, nose, lips, neck, chest, axillae, abdomen, back, buttocks, bilateral upper extremities, bilateral lower extremities, hands, feet, fingers, toes, fingernails, and toenails. All findings within normal limits unless otherwise noted below.  face, scalp Pinkness lat nose, eyelids  R cheek x 1 Pink scaly macules   Assessment & Plan   Lentigines - Scattered tan macules - Due to sun exposure - Benign-appearing, observe - Recommend daily broad spectrum sunscreen SPF 30+ to sun-exposed areas, reapply every 2 hours as needed. - Call for any changes  Seborrheic Keratoses - Stuck-on, waxy, tan-brown papules and/or plaques  - Benign-appearing - Discussed benign etiology and prognosis. - Observe - Call for any changes  Melanocytic Nevi - Tan-brown and/or pink-flesh-colored symmetric macules and papules - Benign appearing on exam today - Observation - Call clinic for new or changing moles - Recommend daily use of broad spectrum spf 30+ sunscreen to sun-exposed areas.   Hemangiomas -  Red papules - Discussed benign nature - Observe - Call for any changes  Actinic Damage - Chronic condition, secondary to cumulative UV/sun exposure - diffuse scaly erythematous macules with underlying dyspigmentation - Recommend daily broad spectrum sunscreen SPF 30+ to sun-exposed areas, reapply every 2 hours as needed.  - Staying in the shade or wearing Jech sleeves, sun glasses (UVA+UVB protection) and wide brim hats (4-inch brim around the entire circumference of the hat) are also recommended for sun protection.  - Call for new or changing lesions.  Skin cancer screening performed today.   History of Basal Cell Carcinoma of the Skin - No evidence of recurrence today - Recommend regular full body skin exams - Recommend daily broad spectrum sunscreen SPF 30+ to sun-exposed areas, reapply every 2 hours as needed.  - Call if any new or changing lesions are noted between office visits  - L temple  Seborrheic dermatitis face, scalp  Seborrheic Dermatitis  -  is a chronic persistent rash characterized by pinkness and scaling most commonly of the mid face but also can occur on the scalp (dandruff), ears; mid chest, mid back and groin.  It tends to be exacerbated by stress and cooler weather.  People who have neurologic disease may experience new onset or exacerbation of existing seborrheic dermatitis.  The condition is not curable but treatable and can be controlled.  Chronic and persistent condition with duration or expected duration over one year. Condition is symptomatic / bothersome to patient. Not to goal.   Cont Ketoconazole 2% shampoo 3x/wk to face and scalp Cont HC 2.5%  lotion 3d/wk to face/scalp Start Elidel cr qd prn flares to eyelids  Topical steroids (such as triamcinolone, fluocinolone, fluocinonide, mometasone, clobetasol, halobetasol, betamethasone, hydrocortisone) can cause thinning and lightening of the skin if they are used for too Bogen in the same area. Your physician  has selected the right strength medicine for your problem and area affected on the body. Please use your medication only as directed by your physician to prevent side effects.    pimecrolimus (ELIDEL) 1 % cream - face, scalp Apply topically daily. Qd to eyelids until clear, then prn flares  Related Medications ketoconazole (NIZORAL) 2 % shampoo Apply 1 Application topically 3 (three) times a week. Wash face and scalp 3 times weekly  hydrocortisone 2.5 % lotion Apply topically as directed. Apply to face three times per week- Monday, Wednesday, Friday  AK (actinic keratosis) R cheek x 1  Destruction of lesion - R cheek x 1 Complexity: simple   Destruction method: cryotherapy   Informed consent: discussed and consent obtained   Timeout:  patient name, date of birth, surgical site, and procedure verified Lesion destroyed using liquid nitrogen: Yes   Region frozen until ice ball extended beyond lesion: Yes   Outcome: patient tolerated procedure well with no complications   Post-procedure details: wound care instructions given     Return in about 1 year (around 03/28/2023) for TBSE, Hx of BCC, Hx of AKs.  I, Joshua Lane, RMA, am acting as scribe for [name], MD . Documentation: I have reviewed the above documentation for accuracy and completeness, and I agree with the above.  [name], MD

## 2022-10-30 ENCOUNTER — Ambulatory Visit: Payer: Medicare HMO | Admitting: Urology

## 2022-10-30 ENCOUNTER — Encounter: Payer: Self-pay | Admitting: Urology

## 2022-10-30 VITALS — BP 147/84 | HR 93 | Ht 68.0 in | Wt 203.0 lb

## 2022-10-30 DIAGNOSIS — N529 Male erectile dysfunction, unspecified: Secondary | ICD-10-CM

## 2022-10-30 DIAGNOSIS — N486 Induration penis plastica: Secondary | ICD-10-CM | POA: Diagnosis not present

## 2022-10-30 NOTE — Progress Notes (Signed)
Albin Fischer Abdulla,acting as a scribe for Riki Altes, MD., have documented all relevant documentation on the behalf of Riki Altes, MD, as directed by  Riki Altes, MD while in the presence of Riki Altes, MD.   10/30/2022 9:00 AM   Joshua Lane 1946-05-11 [id number]  Referring provider: Jaclyn Shaggy, MD [address]   [place],  [place] [address]   HPI: Joshua Lane is a 77 y.o. 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  6 month history of mid-shaft dorsal curvature, which he estimates at 45 degrees. He has had erectile dysfunction one year prior to onset of the penile curvature and is not sexually active. He was given a PDE5 inhibitor trial, which he tried on two occasions, but he does not remember the particular medication or dose. No pain with erections and curvature has been stable over the last 6 months. No history of penile fracture. He has a prior history of stone disease and states he takes a medications for prostate enlargement. There were no forwarding PCP records.   PMH: Past Medical History:  Diagnosis Date   Actinic keratosis    Basal cell carcinoma 09/20/2020   L temple MOHs 01/05/2021   Diabetes mellitus without complication (HCC)    Hypertension    Smoker 09/10/2016   Type 2 diabetes mellitus with hyperosmolarity without coma, without Hartzell-term current use of insulin (HCC) 05/15/2015    Surgical History: Past Surgical History:  Procedure Laterality Date   APPENDECTOMY     BROW LIFT Bilateral 08/03/2020   Procedure: BLEPHAROPLASTY UPPER EYELID WIT EXCESS SKIN DIABETIC;  Surgeon: Imagene Riches, MD;  Location: [facility];  Service: Ophthalmology;  Laterality: Bilateral;  Diabetic   NECK SURGERY      Home Medications:  Allergies as of 10/30/2022       Reactions   Penicillins    REACTION: ?        Medication List        Accurate as of Oct 30, 2022  9:00 AM. If you have any questions, ask your nurse or doctor.           STOP taking these medications    erythromycin ophthalmic ointment Stopped by: Riki Altes, MD   glimepiride 1 MG tablet Commonly known as: AMARYL Stopped by: Riki Altes, MD   ibuprofen 200 MG tablet Commonly known as: ADVIL Stopped by: Riki Altes, MD   mupirocin ointment 2 % Commonly known as: BACTROBAN Stopped by: Riki Altes, MD   sulfamethoxazole-trimethoprim 800-160 MG tablet Commonly known as: BACTRIM DS Stopped by: Riki Altes, MD       TAKE these medications    amLODipine 10 MG tablet Commonly known as: NORVASC Take 1 tablet (10 mg total) by mouth daily.   aspirin EC 81 MG tablet Take 81 mg by mouth daily.   empagliflozin 25 MG Tabs tablet Commonly known as: JARDIANCE Take 1 tablet (25 mg total) by mouth daily.   glipiZIDE 2.5 MG 24 hr tablet Commonly known as: GLUCOTROL XL Take 1 tablet by mouth daily.   hydrocortisone 2.5 % lotion Apply topically as directed. Apply to face three times per week- Monday, Wednesday, Friday   ketoconazole 2 % shampoo Commonly known as: NIZORAL Apply 1 Application topically 3 (three) times a week. Wash face and scalp 3 times weekly   losartan-hydrochlorothiazide 100-25 MG tablet Commonly known as: HYZAAR Take 1 tablet by mouth  daily.   meloxicam 7.5 MG tablet Commonly known as: MOBIC TAKE 1 TABLET EVERY DAY BY ORAL ROUTE AS NEEDED.   metFORMIN 500 MG tablet Commonly known as: GLUCOPHAGE Take 1,000 mg by mouth 2 (two) times daily.   omeprazole 20 MG capsule Commonly known as: PRILOSEC Take 20 mg by mouth daily.   pimecrolimus 1 % cream Commonly known as: Elidel Apply topically daily. Qd to eyelids until clear, then prn flares   pravastatin 20 MG tablet Commonly known as: PRAVACHOL Take 1 tablet (20 mg total) by mouth daily.   traMADol 50 MG tablet Commonly known as: Ultram Take 1 every 4-6 hours as needed for pain not controlled by Tylenol        Allergies:   Allergies  Allergen Reactions   Penicillins     REACTION: ?     Social History:  reports that he quit smoking about 16 years ago. His smoking use included cigarettes. He has a 55.00 pack-year smoking history. His smokeless tobacco use includes snuff. He reports current alcohol use. He reports that he does not use drugs.   Physical Exam: BP (!) 147/84   Pulse 93   Ht 5\' 8"  (1.727 m)   Wt 203 lb (92.1 kg)   BMI 30.87 kg/m   Constitutional:  Alert and oriented, No acute distress. HEENT: [place] AT, moist mucus membranes.  Trachea midline, no masses. Cardiovascular: No clubbing, cyanosis, or edema. Respiratory: Normal respiratory effort, no increased work of breathing. GI: Abdomen is soft, nontender, nondistended, no abdominal masses GU: Phallus is circumcised. 2 cm dorsal plaque proximal/mid-shaft. Testes descended bilaterally without masses or tenderness. Skin: No rashes, bruises or suspicious lesions. Neurologic: Grossly intact, no focal deficits, moving all 4 extremities. Psychiatric: Normal mood and affect.   Assessment & Plan:    1. Peyroine's disease We discussed the pathophysiology of Peyroine's disease and its unknown cause. We discussed that there is no effective oral medication for Peyroine's disease and the two main treatments are Xiaflex and surgical options. A Xiaflex cycle was reviewed. He does have erectile dysfunction and the option of a penile implant was reviewed.  He states the main reason he wanted to have this evaluated was to make sure there were no Dech-term problems that could occur, and at this point does not desire treatment. He was provided literature on Xiaflex and was instructed to call back if he has any questions.  2. Erectile Dysfunction We discussed a repeat PDE5 inhibitor trial, however, he wants to hold off at this time. He will follow up as needed.     [facility] [address] [place], [place]  [address] [phone]

## 2023-02-28 ENCOUNTER — Other Ambulatory Visit: Payer: Self-pay | Admitting: Dermatology

## 2023-02-28 DIAGNOSIS — L219 Seborrheic dermatitis, unspecified: Secondary | ICD-10-CM

## 2023-04-23 ENCOUNTER — Ambulatory Visit: Payer: Medicare HMO | Admitting: Dermatology

## 2023-05-23 ENCOUNTER — Ambulatory Visit: Payer: Medicare HMO | Admitting: Urology

## 2023-07-14 NOTE — Progress Notes (Signed)
 Cardiology Office Note  Date:  07/15/2023   ID:  Joshua Lane, Joshua Lane 1946-03-31, MRN [id number]  PCP:  Corlis Honor BROCKS, MD   Chief Complaint  Patient presents with   6 month follow up     Doing well.     HPI:  Joshua Lane is a 78 year old gentleman with history of  hypertension,  smoking stopped >10 years ago, type 2 diabetes,  chronic neck pain,  status post surgery of the cervical spine,  upper extremity neuropathy,  presenting for follow-up of his shortness of breath, chest tightness on exertion.  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 to pass DOT physical yearly  Denies shortness of breath or chest pain on exertion, active at baseline, more so in the summer Does weed eating ,mowing  Reports that he has been eating small candy bars A1c higher 7.4 up from 7.1  Chronic neck discomfort, stable No regular exercise program  Prior testing reviewed stress test 05/2015  no significant ischemia  EKG personally reviewed by myself on todays visit EKG Interpretation Date/Time:  Tuesday July 15 2023 08:57:35 EST Ventricular Rate:  86 PR Interval:  132 QRS Duration:  82 QT Interval:  354 QTC Calculation: 423 R Axis:   -13  Text Interpretation: Normal sinus rhythm Normal ECG When compared with ECG of 18-Jul-2020 12:38, No significant change was found Confirmed by Perla Lye [phone]) on 07/15/2023 9:00:02 AM   Prior lab work reviewed from November 2024 Total chol 140,LDL 65 HAB1c 7.4    PMH:   has a past medical history of Actinic keratosis, Basal cell carcinoma (09/20/2020), Diabetes mellitus without complication (HCC), Hypertension, Smoker (09/10/2016), and Type 2 diabetes mellitus with hyperosmolarity without coma, without Foskey-term current use of insulin (HCC) (05/15/2015).  PSH:    Past Surgical History:  Procedure Laterality Date   APPENDECTOMY     BROW LIFT Bilateral 08/03/2020   Procedure:  BLEPHAROPLASTY UPPER EYELID WIT EXCESS SKIN DIABETIC;  Surgeon: Ashley Greig HERO, MD;  Location: [facility];  Service: Ophthalmology;  Laterality: Bilateral;  Diabetic   NECK SURGERY      Current Outpatient Medications  Medication Sig Dispense Refill   amLODipine  (NORVASC ) 10 MG tablet Take 1 tablet (10 mg total) by mouth daily. 90 tablet 3   aspirin EC 81 MG tablet Take 81 mg by mouth daily.     empagliflozin  (JARDIANCE ) 25 MG TABS tablet Take 1 tablet (25 mg total) by mouth daily. 90 tablet 3   glipiZIDE  (GLUCOTROL  XL) 2.5 MG 24 hr tablet Take 1 tablet by mouth daily.     hydrocortisone  2.5 % lotion Apply topically as directed. Apply to face three times per week- Monday, Wednesday, Friday 118 mL 6   ketoconazole  (NIZORAL ) 2 % shampoo APPLY 1 APPLICATION TOPICALLY 3 (THREE) TIMES A WEEK. WASH FACE AND SCALP 3 TIMES WEEKLY 120 mL 6   losartan -hydrochlorothiazide (HYZAAR) 100-25 MG tablet Take 1 tablet by mouth daily. 90 tablet 3   meloxicam (MOBIC) 7.5 MG tablet TAKE 1 TABLET EVERY DAY BY ORAL ROUTE AS NEEDED.     metFORMIN  (GLUCOPHAGE ) 500 MG tablet Take 1,000 mg by mouth 2 (two) times daily.  5   omeprazole (PRILOSEC) 20 MG capsule Take 20 mg by mouth daily.     pimecrolimus  (ELIDEL ) 1 % cream Apply topically daily. Qd to eyelids until clear, then prn flares 30 g 11   pravastatin  (PRAVACHOL )  20 MG tablet Take 1 tablet (20 mg total) by mouth daily. 90 tablet 3   tamsulosin (FLOMAX) 0.4 MG CAPS capsule Take 0.4 mg by mouth daily.     traMADol  (ULTRAM ) 50 MG tablet Take 1 every 4-6 hours as needed for pain not controlled by Tylenol  6 tablet 0   No current facility-administered medications for this visit.     Allergies:   Penicillins   Social History:  The patient  reports that he quit smoking about 17 years ago. His smoking use included cigarettes. He started smoking about 72 years ago. He has a 55 pack-year smoking history. His smokeless tobacco use includes snuff. He reports current  alcohol use. He reports that he does not use drugs.   Family History:   Family history is unknown by patient.    Review of Systems: Review of Systems  Constitutional: Negative.   Respiratory: Negative.    Cardiovascular: Negative.   Gastrointestinal: Negative.   Musculoskeletal: Negative.   Neurological: Negative.   Psychiatric/Behavioral: Negative.    All other systems reviewed and are negative.   PHYSICAL EXAM: VS:  BP 120/72 (BP Location: Left Arm, Patient Position: Sitting, Cuff Size: Normal)   Pulse 86   Ht 5' 8.5 (1.74 m)   Wt 204 lb 6 oz (92.7 kg)   SpO2 98%   BMI 30.62 kg/m  , BMI Body mass index is 30.62 kg/m. Constitutional:  oriented to person, place, and time. No distress.  HENT:  Head: Grossly normal Eyes:  no discharge. No scleral icterus.  Neck: No JVD, no carotid bruits  Cardiovascular: Regular rate and rhythm, no murmurs appreciated Pulmonary/Chest: Clear to auscultation bilaterally, no wheezes or rails Abdominal: Soft.  no distension.  no tenderness.  Musculoskeletal: Normal range of motion Neurological:  normal muscle tone. Coordination normal. No atrophy Skin: Skin warm and dry Psychiatric: normal affect, pleasant  Recent Labs: No results found for requested labs within last 365 days.    Lipid Panel No results found for: CHOL, HDL, LDLCALC, TRIG    Wt Readings from Last 3 Encounters:  07/15/23 204 lb 6 oz (92.7 kg)  10/30/22 203 lb (92.1 kg)  01/16/22 206 lb (93.4 kg)     ASSESSMENT AND PLAN:  Essential hypertension - Plan: EKG 12-Lead Blood pressure is well controlled on today's visit. No changes made to the medications.    Type 2 diabetes mellitus with hyperosmolarity without coma, without Kijowski-term current use of insulin (HCC) - HBA1C 7.4, numbers trending upward, diet restriction recommended Managed by Dr. Corlis  Smoker -  Stopped smoking 10 years ago Chronic stable SOB, no change in  symptoms  Hyperlipidemia Cholesterol is at goal on the current lipid regimen. No changes to the medications were made.    DOT physical Cleared from a cardiovascular perspective, no further cardiac testing needed Note provided   Orders Placed This Encounter  Procedures   EKG 12-Lead     Signed, Velinda Lunger, M.D., Ph.D. 07/15/2023  [facility] [place], [place] [phone]

## 2023-07-15 ENCOUNTER — Ambulatory Visit: Payer: Medicare HMO | Attending: Cardiovascular Disease | Admitting: Cardiovascular Disease

## 2023-07-15 ENCOUNTER — Encounter: Payer: Self-pay | Admitting: Cardiovascular Disease

## 2023-07-15 ENCOUNTER — Encounter: Payer: Self-pay | Admitting: Emergency Medicine

## 2023-07-15 VITALS — BP 120/72 | HR 86 | Ht 68.5 in | Wt 204.4 lb

## 2023-07-15 DIAGNOSIS — Z87891 Personal history of nicotine dependence: Secondary | ICD-10-CM | POA: Diagnosis not present

## 2023-07-15 DIAGNOSIS — I119 Hypertensive heart disease without heart failure: Secondary | ICD-10-CM | POA: Diagnosis not present

## 2023-07-15 DIAGNOSIS — E1100 Type 2 diabetes mellitus with hyperosmolarity without nonketotic hyperglycemic-hyperosmolar coma (NKHHC): Secondary | ICD-10-CM

## 2023-07-15 DIAGNOSIS — E782 Mixed hyperlipidemia: Secondary | ICD-10-CM

## 2023-07-15 NOTE — Patient Instructions (Addendum)

## 2023-11-27 ENCOUNTER — Ambulatory Visit: Payer: Medicare HMO | Admitting: Dermatology

## 2023-12-03 ENCOUNTER — Encounter: Payer: Self-pay | Admitting: Nurse Practitioner

## 2023-12-03 ENCOUNTER — Ambulatory Visit: Payer: Self-pay | Admitting: Nurse Practitioner

## 2023-12-03 VITALS — BP 120/80 | HR 90 | Temp 98.2°F | Resp 16 | Ht 68.5 in | Wt 203.8 lb

## 2023-12-03 DIAGNOSIS — E1100 Type 2 diabetes mellitus with hyperosmolarity without nonketotic hyperglycemic-hyperosmolar coma (NKHHC): Secondary | ICD-10-CM

## 2023-12-03 DIAGNOSIS — I119 Hypertensive heart disease without heart failure: Secondary | ICD-10-CM

## 2023-12-03 DIAGNOSIS — I10 Essential (primary) hypertension: Secondary | ICD-10-CM

## 2023-12-03 LAB — POCT GLYCOSYLATED HEMOGLOBIN (HGB A1C): Hemoglobin A1C: 7.2 % — AB (ref 4.0–5.6)

## 2023-12-03 NOTE — Progress Notes (Unsigned)
 [facility] [address] [place], [place] [address]  Internal MEDICINE  Office Visit Note  Patient Name: Joshua Lane  [id number]  [id number]  Date of Service: 12/03/2023   Complaints/HPI Pt is here for establishment of PCP. Chief Complaint  Patient presents with   New Patient (Initial Visit)   Diabetes   Hypertension   Quality Metric Gaps    Colonoscopy, AWV, TDAP    HPI Joshua Lane presents for a new patient visit to establish care.  Well-appearing 78 y.o. 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  Eye exam: done in February this year  foot exam: done with AWV.  Labs:  due for routine labs  New or worsening pain: none  A1c is 7.2  Current Medication: Outpatient Encounter Medications as of 12/03/2023  Medication Sig   amLODipine  (NORVASC ) 10 MG tablet Take 1 tablet (10 mg total) by mouth daily.   aspirin EC 81 MG tablet Take 81 mg by mouth daily.   empagliflozin  (JARDIANCE ) 25 MG TABS tablet Take 1 tablet (25 mg total) by mouth daily.   glipiZIDE (GLUCOTROL XL) 2.5 MG 24 hr tablet Take 1 tablet by mouth daily.   hydrocortisone  2.5 % lotion Apply topically as directed. Apply to face three times per week- Monday, Wednesday, Friday   ketoconazole  (NIZORAL ) 2 % shampoo APPLY 1 APPLICATION TOPICALLY 3 (THREE) TIMES A WEEK. WASH FACE AND SCALP 3 TIMES WEEKLY   losartan -hydrochlorothiazide (HYZAAR) 100-25 MG tablet Take 1 tablet by mouth daily.   meloxicam (MOBIC) 7.5 MG tablet TAKE 1 TABLET EVERY DAY BY ORAL ROUTE AS NEEDED.   metFORMIN (GLUCOPHAGE) 500 MG tablet Take 1,000 mg by mouth 2 (two) times daily.   omeprazole (PRILOSEC) 20 MG  capsule Take 20 mg by mouth daily.   pravastatin  (PRAVACHOL ) 20 MG tablet Take 1 tablet (20 mg total) by mouth daily.   tamsulosin (FLOMAX) 0.4 MG CAPS capsule Take 0.4 mg by mouth daily.   [DISCONTINUED] pimecrolimus  (ELIDEL ) 1 % cream Apply topically daily. Qd to eyelids until clear, then prn flares   [DISCONTINUED] traMADol  (ULTRAM ) 50 MG tablet Take 1 every 4-6 hours as needed for pain not controlled by Tylenol    No facility-administered encounter medications on file as of 12/03/2023.    Surgical History: Past Surgical History:  Procedure Laterality Date   APPENDECTOMY     BROW LIFT Bilateral 08/03/2020   Procedure: BLEPHAROPLASTY UPPER EYELID WIT EXCESS SKIN DIABETIC;  Surgeon: Ashley Greig HERO, MD;  Location[facility];  Service: Ophthalmology;  Laterality: Bilateral;  Diabetic   NECK SURGERY      Medical History: Past Medical History:  Diagnosis Date   Actinic keratosis    Basal cell carcinoma 09/20/2020   L temple MOHs 01/05/2021   Diabetes mellitus without complication (HCC)    Hypertension    Smoker 09/10/2016   Type 2 diabetes mellitus with hyperosmolarity without coma, without Lohmeyer-term current use of insulin (HCC) 05/15/2015    Family History: Family History  Problem Relation Age of Onset   Diabetes Mother     Social History   Socioeconomic History   Marital status: Married    Spouse name:  Not on file   Number of children: Not on file   Years of education: Not on file   Highest education level: Not on file  Occupational History   Not on file  Tobacco Use   Smoking status: Former    Current packs/day: 0.00    Average packs/day: 1 pack/day for 55.0 years (55.0 ttl pk-yrs)    Types: Cigarettes    Start date: 06/20/1949    Quit date: 06/19/2006    Years since quitting: 17.4   Smokeless tobacco: Current    Types: Snuff  Vaping Use   Vaping status: Never Used  Substance and Sexual Activity   Alcohol use: Yes    Comment: Drinks 1 beer or mixed drink  every other night.    Drug use: No   Sexual activity: Not Currently  Other Topics Concern   Not on file  Social History Narrative   Not on file   Social Drivers of Health   Financial Resource Strain: Not on file  Food Insecurity: Not on file  Transportation Needs: Not on file  Physical Activity: Not on file  Stress: Not on file  Social Connections: Not on file  Intimate Partner Violence: Not on file     Review of Systems  Constitutional:  Negative for chills, fatigue and unexpected weight change.  HENT:  Negative for congestion, postnasal drip, rhinorrhea, sneezing and sore throat.   Eyes:  Negative for redness.  Respiratory: Negative.  Negative for cough, chest tightness, shortness of breath and wheezing.   Cardiovascular: Negative.  Negative for chest pain and palpitations.  Gastrointestinal:  Negative for abdominal pain, constipation, diarrhea, nausea and vomiting.  Genitourinary:  Negative for dysuria and frequency.  Musculoskeletal:  Negative for arthralgias, back pain, joint swelling and neck pain.  Skin:  Negative for rash.  Neurological: Negative.  Negative for tremors and numbness.  Hematological:  Negative for adenopathy. Does not bruise/bleed easily.  Psychiatric/Behavioral:  Negative for behavioral problems (Depression), sleep disturbance and suicidal ideas. The patient is not nervous/anxious.     Vital Signs: BP 120/80   Pulse 90   Temp 98.2 F (36.8 C)   Resp 16   Ht 5' 8.5 (1.74 m)   Wt 203 lb 12.8 oz (92.4 kg)   SpO2 98%   BMI 30.54 kg/m    Physical Exam Vitals reviewed.  Constitutional:      General: He is not in acute distress.    Appearance: Normal appearance. He is obese. He is not ill-appearing.  HENT:     Head: Normocephalic and atraumatic.  Eyes:     Pupils: Pupils are equal, round, and reactive to light.  Cardiovascular:     Rate and Rhythm: Normal rate and regular rhythm.  Pulmonary:     Effort: Pulmonary effort is normal. No  respiratory distress.  Neurological:     Mental Status: He is alert and oriented to person, place, and time.  Psychiatric:        Mood and Affect: Mood normal.        Behavior: Behavior normal.       Assessment/Plan: 1. Type 2 diabetes mellitus with hyperosmolarity without coma, without Mcphatter-term current use of insulin (HCC) (Primary) Routine labs ordered  - POCT HgB A1C - Urine Microalbumin w/creat. ratio - CBC with Differential/Platelet - CMP14+EGFR - Lipid Profile - Hgb A1C w/o eAG  2. 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 I have discussed any further diagnostic evaluation that may be needed or ordered today. We also reviewed his medications today. he has been encouraged to call the office with any questions or concerns that should arise related to todays visit.    Orders Placed This Encounter  Procedures   Urine Microalbumin w/creat. ratio   CBC with Differential/Platelet   CMP14+EGFR   Lipid Profile   Hgb A1C w/o eAG   POCT HgB A1C    No orders of the defined types were placed in this encounter.   Return in about 4 months (around 04/03/2024) for F/U, Recheck A1C, Margaurite Salido PCP, Labs.  Time spent:30 Minutes Time spent with patient included reviewing progress notes, labs, imaging studies, and discussing plan for follow up.   [place] Controlled Substance Database was reviewed by me for overdose risk score (ORS)   This patient was seen by Mardy Maxin, FNP-C in collaboration with Dr. Sigrid Bathe as a part of collaborative care agreement.   Tonja Jezewski R. Maxin, MSN, FNP-C Internal Medicine

## 2023-12-04 LAB — MICROALBUMIN / CREATININE URINE RATIO
Creatinine, Urine: 49 mg/dL
Microalb/Creat Ratio: 28 mg/g{creat} (ref 0–29)
Microalbumin, Urine: 13.7 ug/mL

## 2023-12-29 ENCOUNTER — Ambulatory Visit: Admitting: Dermatology

## 2024-01-06 ENCOUNTER — Encounter: Payer: Self-pay | Admitting: Nurse Practitioner

## 2024-01-10 DIAGNOSIS — J209 Acute bronchitis, unspecified: Secondary | ICD-10-CM | POA: Diagnosis not present

## 2024-01-17 DIAGNOSIS — J209 Acute bronchitis, unspecified: Secondary | ICD-10-CM | POA: Diagnosis not present

## 2024-01-26 ENCOUNTER — Telehealth: Payer: Self-pay

## 2024-01-26 NOTE — Telephone Encounter (Signed)
 Pt wife called that she need copy of A1c result done in office advised is ready for pickup

## 2024-01-27 ENCOUNTER — Ambulatory Visit: Admitting: Dermatology

## 2024-01-27 DIAGNOSIS — Z7189 Other specified counseling: Secondary | ICD-10-CM

## 2024-01-27 DIAGNOSIS — L219 Seborrheic dermatitis, unspecified: Secondary | ICD-10-CM

## 2024-01-27 DIAGNOSIS — L578 Other skin changes due to chronic exposure to nonionizing radiation: Secondary | ICD-10-CM | POA: Diagnosis not present

## 2024-01-27 DIAGNOSIS — B078 Other viral warts: Secondary | ICD-10-CM

## 2024-01-27 DIAGNOSIS — L570 Actinic keratosis: Secondary | ICD-10-CM | POA: Diagnosis not present

## 2024-01-27 DIAGNOSIS — W908XXA Exposure to other nonionizing radiation, initial encounter: Secondary | ICD-10-CM

## 2024-01-27 DIAGNOSIS — Z85828 Personal history of other malignant neoplasm of skin: Secondary | ICD-10-CM

## 2024-01-27 DIAGNOSIS — L820 Inflamed seborrheic keratosis: Secondary | ICD-10-CM

## 2024-01-27 DIAGNOSIS — L814 Other melanin hyperpigmentation: Secondary | ICD-10-CM | POA: Diagnosis not present

## 2024-01-27 DIAGNOSIS — D229 Melanocytic nevi, unspecified: Secondary | ICD-10-CM

## 2024-01-27 DIAGNOSIS — Z1283 Encounter for screening for malignant neoplasm of skin: Secondary | ICD-10-CM | POA: Diagnosis not present

## 2024-01-27 DIAGNOSIS — D2371 Other benign neoplasm of skin of right lower limb, including hip: Secondary | ICD-10-CM

## 2024-01-27 DIAGNOSIS — Z79899 Other long term (current) drug therapy: Secondary | ICD-10-CM

## 2024-01-27 DIAGNOSIS — D239 Other benign neoplasm of skin, unspecified: Secondary | ICD-10-CM

## 2024-01-27 MED ORDER — KETOCONAZOLE 2 % EX SHAM: 1.0000 | MEDICATED_SHAMPOO | CUTANEOUS | 11 refills | Status: AC

## 2024-01-27 MED ORDER — HYDROCORTISONE 2.5 % EX LOTN: TOPICAL_LOTION | CUTANEOUS | 11 refills | Status: AC

## 2024-01-27 NOTE — Progress Notes (Unsigned)
 Follow-Up Visit   Subjective  Joshua Lane is a 78 y.o. male who presents for the following: Skin Cancer Screening and Full Body Skin Exam. Hx of BCC and Seborrheic Dermatitis uses ketoconazole  2% shampoo, HC 2.5% lotion prn, never picked up elidel  from pharmacy. Patient does have place at L middle finger that gets irritated.   The patient presents for Total-Body Skin Exam (TBSE) for skin cancer screening and mole check. The patient has spots, moles and lesions to be evaluated, some may be new or changing and the patient may have concern these could be cancer.  The following portions of the chart were reviewed this encounter and updated as appropriate: medications, allergies, medical history  Review of Systems:  No other skin or systemic complaints except as noted in HPI or Assessment and Plan.  Objective  Well appearing patient in no apparent distress; mood and affect are within normal limits.  A full examination was performed including scalp, head, eyes, ears, nose, lips, neck, chest, axillae, abdomen, back, buttocks, bilateral upper extremities, bilateral lower extremities, hands, feet, fingers, toes, fingernails, and toenails. All findings within normal limits unless otherwise noted below.   Relevant physical exam findings are noted in the Assessment and Plan.  L vular middle finger at PIP joint area x1, L index finger at PIP x1 (2) Verrucous papules -- Discussed viral etiology and contagion. R temple x4 (4) Pink scaly macules R forearm x1 Stuck on waxy paps with erythema  Assessment & Plan   SKIN CANCER SCREENING PERFORMED TODAY.  ACTINIC DAMAGE - Chronic condition, secondary to cumulative UV/sun exposure - diffuse scaly erythematous macules with underlying dyspigmentation - Recommend daily broad spectrum sunscreen SPF 30+ to sun-exposed areas, reapply every 2 hours as needed.  - Staying in the shade or wearing Parisi sleeves, sun glasses (UVA+UVB protection) and wide brim  hats (4-inch brim around the entire circumference of the hat) are also recommended for sun protection.  - Call for new or changing lesions.  LENTIGINES, SEBORRHEIC KERATOSES, HEMANGIOMAS - Benign normal skin lesions - Benign-appearing - Call for any changes  MELANOCYTIC NEVI - Tan-brown and/or pink-flesh-colored symmetric macules and papules - Benign appearing on exam today - Observation - Call clinic for new or changing moles - Recommend daily use of broad spectrum spf 30+ sunscreen to sun-exposed areas.   DERMATOFIBROMA Exam: Firm pink/brown papulenodule with dimple sign at R lower leg.  Treatment Plan: A dermatofibroma is a benign growth possibly related to trauma, such as an insect bite, cut from shaving, or inflamed acne-type bump.  Treatment options to remove include shave or excision with resulting scar and risk of recurrence.  Since benign-appearing and not bothersome, will observe for now.   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  - Call if any new or changing lesions are noted between office visits   SEBORRHEIC DERMATITIS Exam: Pink patches with greasy scale at glabella Chronic and persistent condition with duration or expected duration over one year. Condition is bothersome/symptomatic for patient. Currently flared. Seborrheic Dermatitis is a chronic persistent rash characterized by pinkness and scaling most commonly of the mid face but also can occur on the scalp (dandruff), ears; mid chest, mid back and groin.  It tends to be exacerbated by stress and cooler weather.  People who  have neurologic disease may experience new onset or exacerbation of existing seborrheic dermatitis.  The condition is not curable but treatable and can be controlled. Treatment Plan: Cont  Ketoconazole  2% shampoo daily to face, scalp, ears Restart HC 2.5% lotion 3d/wk at bedtime MWF to face/scalp   Topical steroids (such as triamcinolone, fluocinolone, fluocinonide, mometasone, clobetasol, halobetasol, betamethasone, hydrocortisone ) can cause thinning and lightening of the skin if they are used for too [place] in the same area. Your physician has selected the right strength medicine for your problem and area affected on the body. Please use your medication only as directed by your physician to prevent side effects.    OTHER VIRAL WARTS (2) L vular middle finger at PIP joint area x1, L index finger at PIP x1 (2) Viral Wart (HPV) Counseling  Discussed viral / HPV (Human Papilloma Virus) etiology and risk of spread /infectivity to other areas of body as well as to other people.  Multiple treatments and methods may be required to clear warts and it is possible treatment may not be successful.  Treatment risks include discoloration; scarring and there is still potential for wart recurrence.  Discussed may require more than one cryotherapy treatment in the future.  Destruction of lesion - L vular middle finger at PIP joint area x1, L index finger at PIP x1 (2) Complexity: simple   Destruction method: cryotherapy   Informed consent: discussed and consent obtained   Timeout:  patient name, date of birth, surgical site, and procedure verified Lesion destroyed using liquid nitrogen: Yes   Region frozen until ice ball extended beyond lesion: Yes   Outcome: patient tolerated procedure well with no complications   Post-procedure details: wound care instructions given   Additional details:  Prior to procedure, discussed risks of blister formation, small wound, skin dyspigmentation, or rare scar following cryotherapy. Recommend Vaseline ointment to treated areas while healing.   AK (ACTINIC KERATOSIS) (4) R temple x4 (4) Actinic keratoses are precancerous spots that appear secondary to  cumulative UV radiation exposure/sun exposure over time. They are chronic with expected duration over 1 year. A portion of actinic keratoses will progress to squamous cell carcinoma of the skin. It is not possible to reliably predict which spots will progress to skin cancer and so treatment is recommended to prevent development of skin cancer.  Recommend daily broad spectrum sunscreen SPF 30+ to sun-exposed areas, reapply every 2 hours as needed.  Recommend staying in the shade or wearing Keshishyan sleeves, sun glasses (UVA+UVB protection) and wide brim hats (4-inch brim around the entire circumference of the hat). Call for new or changing lesions. Destruction of lesion - R temple x4 (4) Complexity: simple   Destruction method: cryotherapy   Informed consent: discussed and consent obtained   Timeout:  patient name, date of birth, surgical site, and procedure verified Lesion destroyed using liquid nitrogen: Yes   Region frozen until ice ball extended beyond lesion: Yes   Outcome: patient tolerated procedure well with no complications   Post-procedure details: wound care instructions given   Additional details:  Prior to procedure, discussed risks of blister formation, small wound, skin dyspigmentation, or rare scar following cryotherapy. Recommend Vaseline ointment to treated areas while healing.   SEBORRHEIC DERMATITIS   Related Medications hydrocortisone  2.5 % lotion Apply topically as directed. Apply to face three times per week- Monday, Wednesday, Friday ketoconazole  (NIZORAL ) 2 % shampoo Apply 1 Application topically 3 (three) times a week. Wash face, scalp, ears daily. INFLAMED SEBORRHEIC KERATOSIS R  forearm x1 Symptomatic, irritating, patient would like treated. Destruction of lesion - R forearm x1 Complexity: simple   Destruction method: cryotherapy   Informed consent: discussed and consent obtained   Timeout:  patient name, date of birth, surgical site, and procedure  verified Lesion destroyed using liquid nitrogen: Yes   Region frozen until ice ball extended beyond lesion: Yes   Outcome: patient tolerated procedure well with no complications   Post-procedure details: wound care instructions given   Additional details:  Prior to procedure, discussed risks of blister formation, small wound, skin dyspigmentation, or rare scar following cryotherapy. Recommend Vaseline ointment to treated areas while healing.   Return in about 1 year (around 01/26/2025) for TBSE, w/ Dr. Hester, [facility], [facility].  I, Jacquelynn V. Wilfred, CMA, am acting as scribe for Alm Hester, MD .   Documentation: I have reviewed the above documentation for accuracy and completeness, and I agree with the above.  Alm Hester, MD

## 2024-01-27 NOTE — Patient Instructions (Addendum)

## 2024-01-28 ENCOUNTER — Encounter: Payer: Self-pay | Admitting: Dermatology

## 2024-02-03 ENCOUNTER — Encounter: Payer: Self-pay | Admitting: Nurse Practitioner

## 2024-02-03 ENCOUNTER — Ambulatory Visit (INDEPENDENT_AMBULATORY_CARE_PROVIDER_SITE_OTHER): Admitting: Nurse Practitioner

## 2024-02-03 VITALS — BP 134/82 | HR 76 | Temp 98.1°F | Resp 16 | Ht 68.5 in | Wt 201.8 lb

## 2024-02-03 DIAGNOSIS — R319 Hematuria, unspecified: Secondary | ICD-10-CM | POA: Diagnosis not present

## 2024-02-03 DIAGNOSIS — H6123 Impacted cerumen, bilateral: Secondary | ICD-10-CM

## 2024-02-03 DIAGNOSIS — N390 Urinary tract infection, site not specified: Secondary | ICD-10-CM | POA: Diagnosis not present

## 2024-02-03 DIAGNOSIS — H9193 Unspecified hearing loss, bilateral: Secondary | ICD-10-CM

## 2024-02-03 DIAGNOSIS — R300 Dysuria: Secondary | ICD-10-CM

## 2024-02-03 DIAGNOSIS — R109 Unspecified abdominal pain: Secondary | ICD-10-CM | POA: Diagnosis not present

## 2024-02-03 DIAGNOSIS — Z125 Encounter for screening for malignant neoplasm of prostate: Secondary | ICD-10-CM

## 2024-02-03 LAB — POCT URINALYSIS DIPSTICK
Bilirubin, UA: NEGATIVE
Glucose, UA: POSITIVE — AB
Leukocytes, UA: NEGATIVE
Nitrite, UA: NEGATIVE
Protein, UA: NEGATIVE
Spec Grav, UA: 1.01 (ref 1.010–1.025)
Urobilinogen, UA: 0.2 U/dL
pH, UA: 5 (ref 5.0–8.0)

## 2024-02-03 NOTE — Progress Notes (Unsigned)
 [facility] [address] [place], [place] [address]  Internal MEDICINE  Office Visit Note  Patient Name: Joshua Lane  [id number]  [id number]  Date of Service: 02/03/2024  Chief Complaint  Patient presents with   Acute Visit    Low back pain.      HPI [name] presents for an acute sick visit for right flank pain Clemens 1 year ago on his back, bruised up  Started having pain about 2 months  Also has had increase in loose bowels  No new medications recently  Hard of hearing, ears stopped up  Current Medication:  Outpatient Encounter Medications as of 02/03/2024  Medication Sig   amLODipine  (NORVASC ) 10 MG tablet Take 1 tablet (10 mg total) by mouth daily.   aspirin EC 81 MG tablet Take 81 mg by mouth daily.   empagliflozin  (JARDIANCE ) 25 MG TABS tablet Take 1 tablet (25 mg total) by mouth daily.   glipiZIDE (GLUCOTROL XL) 2.5 MG 24 hr tablet Take 1 tablet by mouth daily.   hydrocortisone  2.5 % lotion Apply topically as directed. Apply to face three times per week- Monday, Wednesday, Friday   ketoconazole  (NIZORAL ) 2 % shampoo Apply 1 Application topically 3 (three) times a week. Wash face, scalp, ears daily.   losartan -hydrochlorothiazide (HYZAAR) 100-25 MG tablet Take 1 tablet by mouth daily.   meloxicam (MOBIC) 7.5 MG tablet TAKE 1 TABLET EVERY DAY BY ORAL ROUTE AS NEEDED.   metFORMIN (GLUCOPHAGE) 500 MG tablet Take 1,000 mg by mouth 2 (two) times daily.   omeprazole (PRILOSEC) 20 MG capsule Take 20 mg by mouth daily.   pravastatin  (PRAVACHOL ) 20 MG tablet Take 1 tablet (20 mg total) by mouth daily.   tamsulosin (FLOMAX) 0.4 MG CAPS capsule Take 0.4 mg by mouth daily.   No facility-administered encounter medications on file as of 02/03/2024.      Medical History: Past Medical History:  Diagnosis Date   Actinic keratosis    Basal cell carcinoma 09/20/2020   L temple MOHs 01/05/2021   Diabetes mellitus without complication (HCC)    Hypertension    Smoker  09/10/2016   Type 2 diabetes mellitus with hyperosmolarity without coma, without Prust-term current use of insulin (HCC) 05/15/2015     Vital Signs: BP 134/82   Pulse 76   Temp 98.1 F (36.7 C)   Resp 16   Ht 5' 8.5 (1.74 m)   Wt 201 lb 12.8 oz (91.5 kg)   SpO2 97%   BMI 30.24 kg/m    Review of Systems  Physical Exam    Assessment/Plan:   General Counseling: [name] verbalizes understanding of the findings of todays visit and agrees with plan of treatment. I have discussed any further diagnostic evaluation that may be needed or ordered today. We also reviewed his medications today. he has been encouraged to call the office with any questions or concerns that should arise related to todays visit.    Counseling:    Orders Placed This Encounter  Procedures   CULTURE, URINE COMPREHENSIVE   US  RENAL   PSA Total (Reflex To Free)   POCT urinalysis dipstick   Ear Lavage    No orders of the defined types were placed in this encounter.   Return for F/U, Enedelia Martorelli PCP ultrasound results. .  [place] Controlled Substance Database was reviewed by me for overdose risk score (ORS)  Time spent:*** Minutes Time spent with patient included reviewing progress notes, labs, imaging studies, and discussing plan for follow up.  This patient was seen by Mardy Maxin, FNP-C in collaboration with Dr. Sigrid Bathe as a part of collaborative care agreement.  Maddex Garlitz R. Maxin, MSN, FNP-C Internal Medicine

## 2024-02-04 ENCOUNTER — Ambulatory Visit
Admission: RE | Admit: 2024-02-04 | Discharge: 2024-02-04 | Disposition: A | Discharge status: Home / Self Care | Source: Ambulatory Visit | Attending: Nurse Practitioner | Admitting: Nurse Practitioner

## 2024-02-04 ENCOUNTER — Telehealth: Payer: Self-pay | Admitting: Nurse Practitioner

## 2024-02-04 DIAGNOSIS — R109 Unspecified abdominal pain: Secondary | ICD-10-CM

## 2024-02-04 DIAGNOSIS — N281 Cyst of kidney, acquired: Secondary | ICD-10-CM | POA: Diagnosis not present

## 2024-02-04 DIAGNOSIS — N3289 Other specified disorders of bladder: Secondary | ICD-10-CM | POA: Diagnosis not present

## 2024-02-04 DIAGNOSIS — H6123 Impacted cerumen, bilateral: Secondary | ICD-10-CM | POA: Insufficient documentation

## 2024-02-04 NOTE — Telephone Encounter (Signed)
 Otolaryngology appointment 03/02/2024 @ [facility]

## 2024-02-04 NOTE — Telephone Encounter (Signed)
 Otolaryngology referral sent via [facility] to [facility]. Notified patient. Gave telephone # [phone]

## 2024-02-08 LAB — CULTURE, URINE COMPREHENSIVE

## 2024-02-12 ENCOUNTER — Telehealth: Payer: Self-pay

## 2024-02-12 ENCOUNTER — Ambulatory Visit
Admission: RE | Admit: 2024-02-12 | Discharge: 2024-02-12 | Disposition: A | Discharge status: Home / Self Care | Source: Ambulatory Visit | Attending: Nurse Practitioner | Admitting: Nurse Practitioner

## 2024-02-12 ENCOUNTER — Other Ambulatory Visit: Payer: Self-pay | Admitting: Nurse Practitioner

## 2024-02-12 ENCOUNTER — Telehealth: Payer: Self-pay | Admitting: Nurse Practitioner

## 2024-02-12 DIAGNOSIS — R109 Unspecified abdominal pain: Secondary | ICD-10-CM

## 2024-02-12 DIAGNOSIS — N200 Calculus of kidney: Secondary | ICD-10-CM | POA: Diagnosis not present

## 2024-02-12 DIAGNOSIS — R93429 Abnormal radiologic findings on diagnostic imaging of unspecified kidney: Secondary | ICD-10-CM | POA: Insufficient documentation

## 2024-02-12 DIAGNOSIS — R300 Dysuria: Secondary | ICD-10-CM | POA: Insufficient documentation

## 2024-02-12 DIAGNOSIS — R1031 Right lower quadrant pain: Secondary | ICD-10-CM

## 2024-02-12 DIAGNOSIS — K5730 Diverticulosis of large intestine without perforation or abscess without bleeding: Secondary | ICD-10-CM | POA: Diagnosis not present

## 2024-02-12 DIAGNOSIS — K8020 Calculus of gallbladder without cholecystitis without obstruction: Secondary | ICD-10-CM | POA: Diagnosis not present

## 2024-02-12 LAB — POCT I-STAT CREATININE: Creatinine, Ser: 1.3 mg/dL — ABNORMAL HIGH (ref 0.61–1.24)

## 2024-02-12 MED ORDER — IOHEXOL 300 MG/ML  SOLN
100.0000 mL | Freq: Once | INTRAMUSCULAR | Status: AC | PRN
  Administered 2024-02-12: 100 mL via INTRAVENOUS

## 2024-02-12 NOTE — Telephone Encounter (Signed)
 Pt wife called that he symptoms getting worse terrible right flank pain and diarrhea advised him as per alyssa that we are going to call U/s result and if his symptoms getting worse go to ED

## 2024-02-12 NOTE — Telephone Encounter (Signed)
 Pt wife advised as per alyssa  ultrasound shows 3 renal cysts that were not there in the past, no stones but previous imaging shows bilateral kidney stones so we may just not be able to see the stones on the ultrasound. there is nothing specific that can be definitively attributed to his pain. if he is doing worse and having additional symptoms, he should go to the ER so they can run additional tests. also urine culture was negative for infection. As per pt wife symptoms are not that bad advised her that alyssa will order CT scan and if symptoms worse go to ED

## 2024-02-12 NOTE — Telephone Encounter (Signed)
 Notified wife of Stat CT appointment date, arrival time, location-[facility]

## 2024-02-12 NOTE — Progress Notes (Signed)
 Stat CT ordered due to abnormal renal ultrasound and continued flank pain.

## 2024-02-13 ENCOUNTER — Telehealth: Payer: Self-pay | Admitting: Nurse Practitioner

## 2024-02-13 ENCOUNTER — Other Ambulatory Visit: Payer: Self-pay | Admitting: Nurse Practitioner

## 2024-02-13 DIAGNOSIS — K869 Disease of pancreas, unspecified: Secondary | ICD-10-CM

## 2024-02-13 DIAGNOSIS — R935 Abnormal findings on diagnostic imaging of other abdominal regions, including retroperitoneum: Secondary | ICD-10-CM

## 2024-02-13 NOTE — Telephone Encounter (Signed)
 Notified wife of MRI appointment date, arrival time, location and npo 4 hrs prior-[facility]

## 2024-02-13 NOTE — Progress Notes (Signed)
 Patient wife was notified that MRI is being ordered and the wife stated it will have to be one day next week as he is gone out in his truck.

## 2024-02-17 ENCOUNTER — Other Ambulatory Visit: Payer: Self-pay | Admitting: Nurse Practitioner

## 2024-02-17 ENCOUNTER — Ambulatory Visit
Admission: RE | Admit: 2024-02-17 | Discharge: 2024-02-17 | Disposition: A | Discharge status: Home / Self Care | Source: Ambulatory Visit | Attending: Nurse Practitioner | Admitting: Nurse Practitioner

## 2024-02-17 DIAGNOSIS — K5730 Diverticulosis of large intestine without perforation or abscess without bleeding: Secondary | ICD-10-CM | POA: Diagnosis not present

## 2024-02-17 DIAGNOSIS — K862 Cyst of pancreas: Secondary | ICD-10-CM

## 2024-02-17 DIAGNOSIS — K869 Disease of pancreas, unspecified: Secondary | ICD-10-CM | POA: Insufficient documentation

## 2024-02-17 DIAGNOSIS — K8020 Calculus of gallbladder without cholecystitis without obstruction: Secondary | ICD-10-CM | POA: Diagnosis not present

## 2024-02-17 DIAGNOSIS — R935 Abnormal findings on diagnostic imaging of other abdominal regions, including retroperitoneum: Secondary | ICD-10-CM | POA: Diagnosis not present

## 2024-02-17 DIAGNOSIS — K760 Fatty (change of) liver, not elsewhere classified: Secondary | ICD-10-CM | POA: Diagnosis not present

## 2024-02-17 MED ORDER — GADOBUTROL 1 MMOL/ML IV SOLN
10.0000 mL | Freq: Once | INTRAVENOUS | Status: AC | PRN
  Administered 2024-02-17: 9 mL via INTRAVENOUS

## 2024-02-25 ENCOUNTER — Ambulatory Visit (INDEPENDENT_AMBULATORY_CARE_PROVIDER_SITE_OTHER): Admitting: Nurse Practitioner

## 2024-02-25 ENCOUNTER — Encounter: Payer: Self-pay | Admitting: Nurse Practitioner

## 2024-02-25 VITALS — BP 128/78 | HR 89 | Temp 97.8°F | Resp 16 | Ht 68.5 in | Wt 202.0 lb

## 2024-02-25 DIAGNOSIS — Z125 Encounter for screening for malignant neoplasm of prostate: Secondary | ICD-10-CM | POA: Diagnosis not present

## 2024-02-25 DIAGNOSIS — D490 Neoplasm of unspecified behavior of digestive system: Secondary | ICD-10-CM

## 2024-02-25 DIAGNOSIS — K8020 Calculus of gallbladder without cholecystitis without obstruction: Secondary | ICD-10-CM | POA: Insufficient documentation

## 2024-02-25 DIAGNOSIS — K760 Fatty (change of) liver, not elsewhere classified: Secondary | ICD-10-CM | POA: Diagnosis not present

## 2024-02-25 DIAGNOSIS — R109 Unspecified abdominal pain: Secondary | ICD-10-CM | POA: Diagnosis not present

## 2024-02-25 DIAGNOSIS — N200 Calculus of kidney: Secondary | ICD-10-CM | POA: Insufficient documentation

## 2024-02-25 DIAGNOSIS — E782 Mixed hyperlipidemia: Secondary | ICD-10-CM | POA: Insufficient documentation

## 2024-02-25 MED ORDER — TRAMADOL HCL 50 MG PO TABS: 50.0000 mg | ORAL_TABLET | Freq: Four times a day (QID) | ORAL | 0 refills | Status: AC | PRN

## 2024-02-25 MED ORDER — METHOCARBAMOL 500 MG PO TABS: 500.0000 mg | ORAL_TABLET | Freq: Four times a day (QID) | ORAL | 2 refills | Status: AC | PRN

## 2024-02-25 NOTE — Progress Notes (Signed)
 [facility] [address] [place], [place] [address]  Internal MEDICINE  Office Visit Note  Patient Name: Joshua Lane  [id number]  [id number]  Date of Service: 02/25/2024  Chief Complaint  Patient presents with   Follow-up    Renal U/S    HPI Joshua Lane presents for a follow-up visit for CT scan and MRI results and next steps.  We discussed the results from the CT scan and the MRI of the abdomen and pelvis.  Probably IPMN measuring 2.1 x 2.0 x 1.6 cm. Report states it was present and unchanged from 07/08/2020 CT scan but this finding was not on the report from 2022. Follow up was not recommended but he is symptomatic with persistent back pain, loose stools and persistent nausea.  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 His most recent liver enzymes were abnormal.  Bilateral punctated kidney stones -- not likely causing the back pain, they are not moving around significantly.  Nonobstructing gallstones with no gangrene and no signs of inflammation. He is also asymptomatic.  Simple renal cysts -- not suspicious, no follow up needed.  There is still a possibility that the pain is musculoskeletal in nature.      Current Medication: Outpatient Encounter Medications as of 02/25/2024  Medication Sig   amLODipine  (NORVASC ) 10 MG tablet Take 1 tablet (10 mg total) by mouth daily.   aspirin EC 81 MG tablet Take 81 mg by mouth daily.   empagliflozin  (JARDIANCE ) 25 MG TABS tablet Take 1 tablet (25 mg total) by mouth daily.   glipiZIDE (GLUCOTROL XL) 2.5 MG 24 hr tablet Take 1 tablet by mouth daily.   hydrocortisone  2.5 % lotion Apply topically as directed. Apply to face three times per week- Monday, Wednesday, Friday   ketoconazole  (NIZORAL ) 2 % shampoo Apply 1 Application topically 3 (three) times a week. Wash face, scalp, ears daily.   losartan -hydrochlorothiazide (HYZAAR) 100-25 MG tablet Take 1 tablet by mouth daily.   meloxicam (MOBIC) 7.5 MG tablet TAKE 1 TABLET EVERY DAY BY  ORAL ROUTE AS NEEDED.   metFORMIN (GLUCOPHAGE) 500 MG tablet Take 1,000 mg by mouth 2 (two) times daily.   methocarbamol  (ROBAXIN ) 500 MG tablet Take 1-2 tablets (500-1,000 mg total) by mouth every 6 (six) hours as needed for muscle spasms.   omeprazole (PRILOSEC) 20 MG capsule Take 20 mg by mouth daily.   pravastatin  (PRAVACHOL ) 20 MG tablet Take 1 tablet (20 mg total) by mouth daily.   tamsulosin (FLOMAX) 0.4 MG CAPS capsule Take 0.4 mg by mouth daily.   traMADol  (ULTRAM ) 50 MG tablet Take 1 tablet (50 mg total) by mouth every 6 (six) hours as needed for up to 5 days for severe pain (pain score 7-10).   No facility-administered encounter medications on file as of 02/25/2024.    Surgical History: Past Surgical History:  Procedure Laterality Date   APPENDECTOMY     BROW LIFT Bilateral 08/03/2020   Procedure: BLEPHAROPLASTY UPPER EYELID WIT EXCESS SKIN DIABETIC;  Surgeon: Ashley Greig HERO, MD;  Location: [facility];  Service: Ophthalmology;  Laterality: Bilateral;  Diabetic   NECK SURGERY      Medical History: Past Medical History:  Diagnosis Date   Actinic keratosis    Basal cell carcinoma 09/20/2020   L temple MOHs 01/05/2021   Diabetes mellitus without complication (HCC)    Hypertension    Smoker 09/10/2016   Type 2 diabetes mellitus with hyperosmolarity without coma, without [place]-term current use of insulin (HCC) 05/15/2015  Family History: Family History  Problem Relation Age of Onset   Diabetes Mother     Social History   Socioeconomic History   Marital status: Married    Spouse name: Not on file   Number of children: Not on file   Years of education: Not on file   Highest education level: Not on file  Occupational History   Not on file  Tobacco Use   Smoking status: Former    Current packs/day: 0.00    Average packs/day: 1 pack/day for 55.0 years (55.0 ttl pk-yrs)    Types: Cigarettes    Start date: 06/20/1949    Quit date: 06/19/2006    Years since  quitting: 17.6   Smokeless tobacco: Current    Types: Snuff  Vaping Use   Vaping status: Never Used  Substance and Sexual Activity   Alcohol use: Yes    Comment: Drinks 1 beer or mixed drink every other night.    Drug use: No   Sexual activity: Not Currently  Other Topics Concern   Not on file  Social History Narrative   Not on file   Social Drivers of Health   Financial Resource Strain: Not on file  Food Insecurity: Not on file  Transportation Needs: Not on file  Physical Activity: Not on file  Stress: Not on file  Social Connections: Not on file  Intimate Partner Violence: Not on file      Review of Systems  Constitutional:  Negative for activity change, appetite change, fatigue and unexpected weight change.  HENT:  Positive for hearing loss.   Respiratory: Negative.  Negative for cough, chest tightness, shortness of breath and wheezing.   Cardiovascular: Negative.  Negative for chest pain and palpitations.  Gastrointestinal: Negative.   Genitourinary:  Positive for difficulty urinating, dysuria and hematuria.       Hesitancy  Musculoskeletal: Negative.   Neurological: Negative.     Vital Signs: BP 128/78   Pulse 89   Temp 97.8 F (36.6 C)   Resp 16   Ht 5' 8.5 (1.74 m)   Wt 202 lb (91.6 kg)   SpO2 94%   BMI 30.27 kg/m    Physical Exam Vitals reviewed.  Constitutional:      General: He is not in acute distress.    Appearance: Normal appearance. He is obese. He is not ill-appearing.  HENT:     Head: Normocephalic and atraumatic.  Eyes:     Pupils: Pupils are equal, round, and reactive to light.  Cardiovascular:     Rate and Rhythm: Normal rate and regular rhythm.  Pulmonary:     Effort: Pulmonary effort is normal. No respiratory distress.  Neurological:     Mental Status: He is alert and oriented to person, place, and time.  Psychiatric:        Mood and Affect: Mood normal.        Behavior: Behavior normal.        Assessment/Plan: 1.  IPMN (intraductal papillary mucinous neoplasm) (Primary) Urgent referral to [facility] and routine and additional labs ordered  - CBC with Differential/Platelet - Hepatic function panel - Cancer antigen 19-9 - CEA - CA 125 - Basic Metabolic Panel (BMET) - Ambulatory referral to Gastroenterology  2. NAFLD (nonalcoholic fatty liver disease) Urgent referral to [facility] GI and routine and additional labs ordered  - CBC with Differential/Platelet - Hepatic function panel - Basic Metabolic Panel (BMET) - Ambulatory referral to Gastroenterology  3. Acute right flank pain Routine  and additional labs ordered. Pain medication ordered as needed  - CBC with Differential/Platelet - Hepatic function panel - traMADol  (ULTRAM ) 50 MG tablet; Take 1 tablet (50 mg total) by mouth every 6 (six) hours as needed for up to 5 days for severe pain (pain score 7-10).  Dispense: 20 tablet; Refill: 0 - methocarbamol  (ROBAXIN ) 500 MG tablet; Take 1-2 tablets (500-1,000 mg total) by mouth every 6 (six) hours as needed for muscle spasms.  Dispense: 120 tablet; Refill: 2  4. Bilateral kidney stones Routine labs ordered  - Basic Metabolic Panel (BMET)  5. Calculus of gallbladder without cholecystitis without obstruction Referred to GI - Ambulatory referral to Gastroenterology  6. Mixed hyperlipidemia Routine lab ordered  - Lipid Profile  7. Screening for prostate cancer Routine lab ordered  - PSA Total (Reflex To Free)   General Counseling: Joshua Lane verbalizes understanding of the findings of todays visit and agrees with plan of treatment. I have discussed any further diagnostic evaluation that may be needed or ordered today. We also reviewed his medications today. he has been encouraged to call the office with any questions or concerns that should arise related to todays visit.    Orders Placed This Encounter  Procedures   CBC with Differential/Platelet   Hepatic function panel   Cancer antigen 19-9   CEA    CA 125   PSA Total (Reflex To Free)   Lipid Profile   Basic Metabolic Panel (BMET)   Ambulatory referral to Gastroenterology    Meds ordered this encounter  Medications   traMADol  (ULTRAM ) 50 MG tablet    Sig: Take 1 tablet (50 mg total) by mouth every 6 (six) hours as needed for up to 5 days for severe pain (pain score 7-10).    Dispense:  20 tablet    Refill:  0    May request a refill.   methocarbamol  (ROBAXIN ) 500 MG tablet    Sig: Take 1-2 tablets (500-1,000 mg total) by mouth every 6 (six) hours as needed for muscle spasms.    Dispense:  120 tablet    Refill:  2    Fill new script today.    Return for keep november appt and has been referral to gi.   Total time spent:30 Minutes Time spent includes review of chart, medications, test results, and follow up plan with the patient.   [place] Controlled Substance Database was reviewed by me.  This patient was seen by Mardy Maxin, FNP-C in collaboration with Dr. Sigrid Bathe as a part of collaborative care agreement.   Fahima Cifelli R. Maxin, MSN, FNP-C Internal medicine

## 2024-02-26 ENCOUNTER — Telehealth: Payer: Self-pay | Admitting: Nurse Practitioner

## 2024-02-26 NOTE — Telephone Encounter (Signed)
 Gastroenterology referral faxed to [facility]; [phone] . Notified patient. Gave pt telephone [phone]

## 2024-03-02 ENCOUNTER — Other Ambulatory Visit
Admission: RE | Admit: 2024-03-02 | Discharge: 2024-03-02 | Disposition: A | Discharge status: Home / Self Care | Attending: Nurse Practitioner | Admitting: Nurse Practitioner

## 2024-03-02 DIAGNOSIS — R109 Unspecified abdominal pain: Secondary | ICD-10-CM | POA: Insufficient documentation

## 2024-03-02 DIAGNOSIS — K760 Fatty (change of) liver, not elsewhere classified: Secondary | ICD-10-CM | POA: Diagnosis not present

## 2024-03-02 DIAGNOSIS — N200 Calculus of kidney: Secondary | ICD-10-CM | POA: Insufficient documentation

## 2024-03-02 DIAGNOSIS — D490 Neoplasm of unspecified behavior of digestive system: Secondary | ICD-10-CM | POA: Diagnosis not present

## 2024-03-02 DIAGNOSIS — E782 Mixed hyperlipidemia: Secondary | ICD-10-CM | POA: Insufficient documentation

## 2024-03-02 DIAGNOSIS — Z125 Encounter for screening for malignant neoplasm of prostate: Secondary | ICD-10-CM | POA: Diagnosis not present

## 2024-03-02 DIAGNOSIS — H93293 Other abnormal auditory perceptions, bilateral: Secondary | ICD-10-CM | POA: Diagnosis not present

## 2024-03-02 DIAGNOSIS — H6123 Impacted cerumen, bilateral: Secondary | ICD-10-CM | POA: Diagnosis not present

## 2024-03-02 LAB — CBC WITH DIFFERENTIAL/PLATELET
Abs Immature Granulocytes: 0.01 K/uL (ref 0.00–0.07)
Basophils Absolute: 0.1 K/uL (ref 0.0–0.1)
Basophils Relative: 1 %
Eosinophils Absolute: 0.4 K/uL (ref 0.0–0.5)
Eosinophils Relative: 6 %
HCT: 44.7 % (ref 39.0–52.0)
Hemoglobin: 15.3 g/dL (ref 13.0–17.0)
Immature Granulocytes: 0 %
Lymphocytes Relative: 42 %
Lymphs Abs: 2.9 K/uL (ref 0.7–4.0)
MCH: 29.8 pg (ref 26.0–34.0)
MCHC: 34.2 g/dL (ref 30.0–36.0)
MCV: 87 fL (ref 80.0–100.0)
Monocytes Absolute: 0.7 K/uL (ref 0.1–1.0)
Monocytes Relative: 11 %
Neutro Abs: 2.7 K/uL (ref 1.7–7.7)
Neutrophils Relative %: 40 %
Platelets: 238 K/uL (ref 150–400)
RBC: 5.14 MIL/uL (ref 4.22–5.81)
RDW: 13.2 % (ref 11.5–15.5)
WBC: 6.8 K/uL (ref 4.0–10.5)
nRBC: 0 % (ref 0.0–0.2)

## 2024-03-02 LAB — HEPATIC FUNCTION PANEL
ALT: 31 U/L (ref 0–44)
AST: 23 U/L (ref 15–41)
Albumin: 3.7 g/dL (ref 3.5–5.0)
Alkaline Phosphatase: 38 U/L (ref 38–126)
Bilirubin, Direct: <0.1 mg/dL (ref 0.0–0.2)
Total Bilirubin: 0.5 mg/dL (ref 0.0–1.2)
Total Protein: 6.4 g/dL — ABNORMAL LOW (ref 6.5–8.1)

## 2024-03-02 LAB — BASIC METABOLIC PANEL WITH GFR
Anion gap: 15 (ref 5–15)
BUN: 25 mg/dL — ABNORMAL HIGH (ref 8–23)
CO2: 27 mmol/L (ref 22–32)
Calcium: 9.2 mg/dL (ref 8.9–10.3)
Chloride: 99 mmol/L (ref 98–111)
Creatinine, Ser: 1.07 mg/dL (ref 0.61–1.24)
GFR, Estimated: >60 mL/min (ref >60)
Glucose, Bld: 143 mg/dL — ABNORMAL HIGH (ref 70–99)
Potassium: 3.7 mmol/L (ref 3.5–5.1)
Sodium: 141 mmol/L (ref 135–145)

## 2024-03-02 LAB — LIPID PANEL
Cholesterol: 127 mg/dL (ref 0–200)
HDL: 56 mg/dL (ref >40)
LDL Cholesterol: 59 mg/dL (ref 0–99)
Total CHOL/HDL Ratio: 2.3 ratio
Triglycerides: 59 mg/dL (ref <150)
VLDL: 12 mg/dL (ref 0–40)

## 2024-03-03 ENCOUNTER — Ambulatory Visit: Admitting: Nurse Practitioner

## 2024-03-03 LAB — MISC LABCORP TEST (SEND OUT): Labcorp test code: 480772

## 2024-03-03 LAB — CEA: CEA: 2.8 ng/mL (ref 0.0–4.7)

## 2024-03-03 LAB — CA 125: Cancer Antigen (CA) 125: 13.2 U/mL

## 2024-03-03 LAB — CA 19-9 (SERIAL): CA 19-9: 91 U/mL — ABNORMAL HIGH (ref 0–35)

## 2024-03-10 ENCOUNTER — Other Ambulatory Visit: Payer: Self-pay

## 2024-03-10 MED ORDER — EMPAGLIFLOZIN 25 MG PO TABS: 25.0000 mg | ORAL_TABLET | Freq: Every day | ORAL | 3 refills | Status: DC

## 2024-03-10 MED ORDER — METFORMIN HCL 500 MG PO TABS: 1000.0000 mg | ORAL_TABLET | Freq: Two times a day (BID) | ORAL | 3 refills | Status: DC

## 2024-03-10 MED ORDER — AMLODIPINE BESYLATE 10 MG PO TABS: 10.0000 mg | ORAL_TABLET | Freq: Every day | ORAL | 3 refills | Status: DC

## 2024-03-10 MED ORDER — GLIPIZIDE ER 2.5 MG PO TB24: 2.5000 mg | ORAL_TABLET | Freq: Every day | ORAL | 3 refills | Status: DC

## 2024-03-10 MED ORDER — LOSARTAN POTASSIUM-HCTZ 100-25 MG PO TABS: 1.0000 | ORAL_TABLET | Freq: Every day | ORAL | 3 refills | Status: DC

## 2024-03-10 MED ORDER — PRAVASTATIN SODIUM 20 MG PO TABS: 20.0000 mg | ORAL_TABLET | Freq: Every day | ORAL | 3 refills | Status: DC

## 2024-03-15 ENCOUNTER — Other Ambulatory Visit: Payer: Self-pay | Admitting: Nurse Practitioner

## 2024-03-15 MED ORDER — ACCU-CHEK GUIDE TEST VI STRP: ORAL_STRIP | 12 refills | Status: DC

## 2024-03-23 ENCOUNTER — Other Ambulatory Visit: Payer: Self-pay

## 2024-03-23 MED ORDER — ACCU-CHEK SOFTCLIX LANCETS MISC: 1 refills | Status: DC

## 2024-03-31 ENCOUNTER — Encounter: Payer: Self-pay | Admitting: Nurse Practitioner

## 2024-03-31 ENCOUNTER — Ambulatory Visit (INDEPENDENT_AMBULATORY_CARE_PROVIDER_SITE_OTHER): Admitting: Nurse Practitioner

## 2024-03-31 VITALS — BP 120/68 | HR 83 | Temp 97.3°F | Resp 16 | Ht 68.5 in | Wt 200.8 lb

## 2024-03-31 DIAGNOSIS — E1100 Type 2 diabetes mellitus with hyperosmolarity without nonketotic hyperglycemic-hyperosmolar coma (NKHHC): Secondary | ICD-10-CM

## 2024-03-31 DIAGNOSIS — D490 Neoplasm of unspecified behavior of digestive system: Secondary | ICD-10-CM

## 2024-03-31 DIAGNOSIS — R978 Other abnormal tumor markers: Secondary | ICD-10-CM | POA: Insufficient documentation

## 2024-03-31 LAB — POCT GLYCOSYLATED HEMOGLOBIN (HGB A1C): Hemoglobin A1C: 6.9 % — AB (ref 4.0–5.6)

## 2024-03-31 NOTE — Progress Notes (Signed)
 [facility] [address] [place], [place] [address]  Internal MEDICINE  Office Visit Note  Patient Name: Joshua Lane  [id number]  [id number]  Date of Service: 03/31/2024  Chief Complaint  Patient presents with   Diabetes   Hypertension   Follow-up   Quality Metric Gaps    Cologuard    HPI Joshua Lane presents for a follow-up visit for diabetes, lab results and pancreatic cyst.  Diabetes -- A1c is improved to 6.9 and is stable from 7.2 previously. He is currently taking jardiance , glipizide  and metformin .  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  Diarrhea -- having diarrhea most of the time. Tried pepto bismol which turned his stool black but did not really help the diarrhea.  Possible passed a kidney stone last month -- right sided back pain has improved.     Current Medication: Outpatient Encounter Medications as of 03/31/2024  Medication Sig   glucose blood (ACCU-CHEK GUIDE TEST) test strip Use 1 test strip to check glucose once daily and as needed for diabetes E11.65   Accu-Chek Softclix Lancets lancets Use as instructed once daily Dx E11.65   amLODipine  (NORVASC ) 10 MG tablet Take 1 tablet (10 mg total) by mouth daily.   aspirin EC 81 MG tablet Take 81 mg by mouth daily.   empagliflozin  (JARDIANCE ) 25 MG TABS tablet Take 1 tablet (25 mg total) by mouth daily.   glipiZIDE  (GLUCOTROL  XL) 2.5 MG 24 hr tablet Take 1 tablet (2.5 mg total) by mouth daily.   hydrocortisone  2.5 % lotion Apply topically as directed. Apply to face three times per week- Monday, Wednesday, Friday   ketoconazole  (NIZORAL ) 2 % shampoo Apply 1 Application topically 3 (three) times a week. Wash face, scalp, ears daily.   losartan -hydrochlorothiazide (HYZAAR) 100-25 MG tablet Take 1 tablet by mouth daily.   meloxicam (MOBIC) 7.5 MG tablet TAKE 1 TABLET EVERY DAY  BY ORAL ROUTE AS NEEDED.   metFORMIN  (GLUCOPHAGE ) 500 MG tablet Take 2 tablets (1,000 mg total) by mouth 2 (two) times daily.   methocarbamol  (ROBAXIN ) 500 MG tablet Take 1-2 tablets (500-1,000 mg total) by mouth every 6 (six) hours as needed for muscle spasms.   omeprazole (PRILOSEC) 20 MG capsule Take 20 mg by mouth daily.   pravastatin  (PRAVACHOL ) 20 MG tablet Take 1 tablet (20 mg total) by mouth daily.   tamsulosin (FLOMAX) 0.4 MG CAPS capsule Take 0.4 mg by mouth daily.   No facility-administered encounter medications on file as of 03/31/2024.    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, without [place]-term current use of insulin (HCC) 05/15/2015    Family History: Family History  Problem Relation Age of Onset   Diabetes Mother     Social History  Socioeconomic History   Marital status: Married    Spouse name: Not on file   Number of children: Not on file   Years of education: Not on file   Highest education level: Not on file  Occupational History   Not on file  Tobacco Use   Smoking status: Former    Current packs/day: 0.00    Average packs/day: 1 pack/day for 55.0 years (55.0 ttl pk-yrs)    Types: Cigarettes    Start date: 06/20/1949    Quit date: 06/19/2006    Years since quitting: 17.7   Smokeless tobacco: Current    Types: Snuff  Vaping Use   Vaping status: Never Used  Substance and Sexual Activity    Alcohol use: Not Currently    Comment: Drinks 1 beer or mixed drink every other night.    Drug use: No   Sexual activity: Not Currently  Other Topics Concern   Not on file  Social History Narrative   Not on file   Social Drivers of Health   Financial Resource Strain: Not on file  Food Insecurity: Not on file  Transportation Needs: Not on file  Physical Activity: Not on file  Stress: Not on file  Social Connections: Not on file  Intimate Partner Violence: Not on file      Review of Systems  Constitutional:  Negative for activity change, appetite change, fatigue and unexpected weight change.  HENT:  Positive for hearing loss.   Respiratory: Negative.  Negative for cough, chest tightness, shortness of breath and wheezing.   Cardiovascular: Negative.  Negative for chest pain and palpitations.  Gastrointestinal:  Positive for abdominal pain, diarrhea and nausea. Negative for vomiting.  Genitourinary:  Negative for difficulty urinating, dysuria and hematuria.       Hesitancy  Musculoskeletal:  Positive for back pain (improved).  Neurological: Negative.     Vital Signs: BP 120/68   Pulse 83   Temp (!) 97.3 F (36.3 C)   Resp 16   Ht 5' 8.5 (1.74 m)   Wt 200 lb 12.8 oz (91.1 kg)   SpO2 97%   BMI 30.09 kg/m    Physical Exam Vitals reviewed.  Constitutional:      General: He is not in acute distress.    Appearance: Normal appearance. He is obese. He is not ill-appearing.  HENT:     Head: Normocephalic and atraumatic.  Eyes:     Pupils: Pupils are equal, round, and reactive to light.  Cardiovascular:     Rate and Rhythm: Normal rate and regular rhythm.  Pulmonary:     Effort: Pulmonary effort is normal. No respiratory distress.  Skin:    Capillary Refill: Capillary refill takes less than 2 seconds.  Neurological:     Mental Status: He is alert and oriented to person, place, and time.  Psychiatric:        Mood and Affect: Mood normal.        Behavior:  Behavior normal.        Assessment/Plan: 1. Elevated CA 19-9 level (Primary) Urgently referred to oncology. With the abnormal pancreatic tumor marker and the questionable pancreatic cyst on the imaging, I am referring to oncology for further evaluation and treatment since gastroenterology does not seem to be concerned about the pancreas.  - Ambulatory referral to Hematology / Oncology  2. IPMN (intraductal papillary mucinous neoplasm) Urgently referred to oncology.  With the abnormal pancreatic tumor marker and the questionable pancreatic cyst on the imaging, I am  referring to oncology for further evaluation and treatment since gastroenterology does not seem to be concerned about the pancreas.  - Ambulatory referral to Hematology / Oncology  3. Type 2 diabetes mellitus with hyperosmolarity without coma, without [place]-term current use of insulin (HCC) A1c is improved and stable at 6.9, follow up in 4 months to recheck A1c.  - POCT glycosylated hemoglobin (Hb A1C)   General Counseling: Jojuan verbalizes understanding of the findings of todays visit and agrees with plan of treatment. I have discussed any further diagnostic evaluation that may be needed or ordered today. We also reviewed his medications today. he has been encouraged to call the office with any questions or concerns that should arise related to todays visit.    Orders Placed This Encounter  Procedures   Ambulatory referral to Hematology / Oncology   POCT glycosylated hemoglobin (Hb A1C)    No orders of the defined types were placed in this encounter.   Return in about 4 months (around 08/01/2024) for F/U, Recheck A1C, Taya Ashbaugh PCP.   Total time spent:30 Minutes Time spent includes review of chart, medications, test results, and follow up plan with the patient.   [place] Controlled Substance Database was reviewed by me.  This patient was seen by Mardy Maxin, FNP-C in collaboration with Dr. Sigrid Bathe as a part of  collaborative care agreement.   Prerna Harold R. Maxin, MSN, FNP-C Internal medicine

## 2024-04-02 ENCOUNTER — Inpatient Hospital Stay

## 2024-04-02 ENCOUNTER — Encounter: Payer: Self-pay | Admitting: Oncology

## 2024-04-02 ENCOUNTER — Inpatient Hospital Stay: Attending: Oncology | Admitting: Oncology

## 2024-04-02 ENCOUNTER — Telehealth: Payer: Self-pay

## 2024-04-02 VITALS — BP 137/75 | HR 88 | Temp 97.8°F | Resp 19 | Wt 199.2 lb

## 2024-04-02 DIAGNOSIS — F17220 Nicotine dependence, chewing tobacco, uncomplicated: Secondary | ICD-10-CM | POA: Insufficient documentation

## 2024-04-02 DIAGNOSIS — K760 Fatty (change of) liver, not elsewhere classified: Secondary | ICD-10-CM | POA: Insufficient documentation

## 2024-04-02 DIAGNOSIS — R197 Diarrhea, unspecified: Secondary | ICD-10-CM | POA: Diagnosis not present

## 2024-04-02 DIAGNOSIS — K862 Cyst of pancreas: Secondary | ICD-10-CM | POA: Diagnosis not present

## 2024-04-02 NOTE — Telephone Encounter (Signed)
 Please offer this patient October 20 at 12 PM for upper EUS. I would normally not consider an EUS but since the CA 19-9 is elevated, it makes sense to move forward with attempt at fine-needle aspiration. Please schedule accordingly. Thank you. GM

## 2024-04-02 NOTE — Telephone Encounter (Signed)
 Referral entered for [facility] for EUS to evaluate pancreas cyst with elevated CA19-9

## 2024-04-03 NOTE — Assessment & Plan Note (Signed)
 Recent new symptom for him.  He has been on Jardiance  and metformin  for longer.  Time. Discussed with patient about GI panel and C. difficile to rule out infectious etiology.  Patient will like to defer.  He has an appointment with [facility] gastroenterology/hepatology for evaluation of fatty liver disease and would like to be worked up there.

## 2024-04-03 NOTE — Progress Notes (Signed)
 Hematology/Oncology Consult note [phone] [phone]        REFERRING PROVIDER: Liana Fish, NP   CHIEF COMPLAINTS/REASON FOR VISIT:  Evaluation of pancreatic cyst, elevated CA 19-9.   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 Recommend further work up with EUS and CNA biopsy for cyst fluid analysis. Refer to GI  Diarrhea Recent new symptom for him.  He has been on Jardiance  and metformin  for longer.  Time. Discussed with patient about GI panel and C. difficile to rule out infectious etiology.  Patient will like to defer.  He has an appointment with [facility] gastroenterology/hepatology for evaluation of fatty liver disease and would like to be worked up there.  NAFLD (nonalcoholic fatty liver disease) Normal LFT.  He has an appointment with [facility] hepatology.  Recommend healthy diet and exercise   Orders Placed This Encounter  Procedures   Ambulatory referral to Gastroenterology    Referral Priority:   Routine    Referral Type:   Consultation    Referral Reason:   Specialty Services Required    Referred to Provider:   Mansouraty, Aloha Raddle., MD    Number of Visits Requested:   1    All questions were answered. The patient knows to call the clinic with any problems, questions or concerns.  Zelphia Cap, MD, PhD [facility] Hematology Oncology 04/02/2024   HISTORY OF PRESENTING ILLNESS:   Joshua Lane is a  78 y.o.  male with PMH listed below was seen in consultation at the request of  Liana Fish, NP  for evaluation of pancreatic cyst.  02/12/2024, patient had a CT abdomen pelvis with and without scan done as workup of flank pain. CT showed no acute findings to explain his symptoms.  Punctate nonobstructing bilateral renal calculi.  Ill-defined low-density lesion in the uncinate process of pancreas.  Slightly larger than on the previous noncontrast  study.  Severe hepatosteatosis.  Cholelithiasis without evidence of cholecystitis or bile duct dilatation.  Aortic atherosclerosis.  02/19/2024, MRI showed Multiseptated cystic lesion in the dorsal pancreatic head measuring 2.1 x 2.1 x 1.6 cm. In retrospective review of examination dated 07/18/2020, this lesion was present and not significantly changed at that time measuring 2.1 x 2.0 x 1.6 cm when measured with similar technique. consistent with a side branch IPMN  Hepatosteatosis.  Cholelithiasis.  Descending colonic diverticulosis.  Patient reports that his flank pain lasted a few days and then resolved.  It was felt that symptoms was due to passing kidney stone.   03/02/2024 CA 19.9  91.  CEA 2.8. Patient denies unintentional weight loss, fever.    Patient also reports having diarrhea for few weeks.  Usually 3-4 times per day.  Usually the first 1 in the morning is semiformed, and then watery diarrhea afterwards. Denies abdominal pain, blood in the stool, melena.  Patient is on Jardiance  and metformin .  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, without Salaz-term current use of insulin (HCC) 05/15/2015    SURGICAL HISTORY: Past Surgical History:  Procedure Laterality Date   APPENDECTOMY     BROW LIFT Bilateral 08/03/2020   Procedure: BLEPHAROPLASTY UPPER EYELID WIT EXCESS  SKIN DIABETIC;  Surgeon: Ashley Greig HERO, MD;  Location: [facility];  Service: Ophthalmology;  Laterality: Bilateral;  Diabetic   NECK SURGERY      SOCIAL HISTORY: Social History   Socioeconomic History   Marital status: Married    Spouse name: Not on file   Number of children: Not on file   Years of education: Not on file   Highest education level: Not on file   Occupational History   Not on file  Tobacco Use   Smoking status: Former    Current packs/day: 0.00    Average packs/day: 1 pack/day for 55.0 years (55.0 ttl pk-yrs)    Types: Cigarettes    Start date: 06/20/1949    Quit date: 06/19/2006    Years since quitting: 17.8   Smokeless tobacco: Current    Types: Snuff  Vaping Use   Vaping status: Never Used  Substance and Sexual Activity   Alcohol use: Not Currently    Comment: Drinks 1 beer or mixed drink every other night.    Drug use: No   Sexual activity: Not Currently    Birth control/protection: None  Other Topics Concern   Not on file  Social History Narrative   Not on file   Social Drivers of Health   Financial Resource Strain: Not on file  Food Insecurity: No Food Insecurity (04/02/2024)   Hunger Vital Sign    Worried About Running Out of Food in the Last Year: Never true    Ran Out of Food in the Last Year: Never true  Transportation Needs: No Transportation Needs (04/02/2024)   PRAPARE - Administrator, Civil Service (Medical): No    Lack of Transportation (Non-Medical): No  Physical Activity: Not on file  Stress: Not on file  Social Connections: Not on file  Intimate Partner Violence: Not At Risk (04/02/2024)   Humiliation, Afraid, Rape, and Kick questionnaire    Fear of Current or Ex-Partner: No    Emotionally Abused: No    Physically Abused: No    Sexually Abused: No    FAMILY HISTORY: Family History  Problem Relation Age of Onset   Diabetes Mother     ALLERGIES:  is allergic to penicillins.  MEDICATIONS:  Current Outpatient Medications  Medication Sig Dispense Refill   Accu-Chek Softclix Lancets lancets Use as instructed once daily Dx E11.65 100 each 1   amLODipine  (NORVASC ) 10 MG tablet Take 1 tablet (10 mg total) by mouth daily. 90 tablet 3   aspirin EC 81 MG tablet Take 81 mg by mouth daily.     empagliflozin  (JARDIANCE ) 25 MG TABS tablet Take 1 tablet (25 mg total) by mouth daily. 90  tablet 3   glipiZIDE  (GLUCOTROL  XL) 2.5 MG 24 hr tablet Take 1 tablet (2.5 mg total) by mouth daily. 90 tablet 3   glucose blood (ACCU-CHEK GUIDE TEST) test strip Use 1 test strip to check glucose once daily and as needed for diabetes E11.65 100 each 12   hydrocortisone  2.5 % lotion Apply topically as directed. Apply to face three times per week- Monday, Wednesday, Friday 118 mL 11   ketoconazole  (NIZORAL ) 2 % shampoo Apply 1 Application topically 3 (three) times a week. Wash face, scalp, ears daily. 120 mL 11   losartan -hydrochlorothiazide (HYZAAR) 100-25 MG tablet Take 1 tablet by mouth daily. 90 tablet 3   meloxicam (MOBIC) 7.5 MG tablet TAKE 1 TABLET EVERY DAY BY ORAL ROUTE AS NEEDED.     metFORMIN  (GLUCOPHAGE )  500 MG tablet Take 2 tablets (1,000 mg total) by mouth 2 (two) times daily. 120 tablet 3   methocarbamol  (ROBAXIN ) 500 MG tablet Take 1-2 tablets (500-1,000 mg total) by mouth every 6 (six) hours as needed for muscle spasms. 120 tablet 2   omeprazole (PRILOSEC) 20 MG capsule Take 20 mg by mouth daily.     pravastatin  (PRAVACHOL ) 20 MG tablet Take 1 tablet (20 mg total) by mouth daily. 90 tablet 3   tamsulosin (FLOMAX) 0.4 MG CAPS capsule Take 0.4 mg by mouth daily.     No current facility-administered medications for this visit.    Review of Systems  Constitutional:  Negative for appetite change, chills, fatigue, fever and unexpected weight change.  HENT:   Negative for hearing loss and voice change.   Eyes:  Negative for eye problems and icterus.  Respiratory:  Negative for chest tightness, cough and shortness of breath.   Cardiovascular:  Negative for chest pain and leg swelling.  Gastrointestinal:  Negative for abdominal distention and abdominal pain.  Endocrine: Negative for hot flashes.  Genitourinary:  Negative for difficulty urinating, dysuria and frequency.   Musculoskeletal:  Negative for arthralgias.  Skin:  Negative for itching and rash.  Neurological:  Negative for  light-headedness and numbness.  Hematological:  Negative for adenopathy. Does not bruise/bleed easily.  Psychiatric/Behavioral:  Negative for confusion.    PHYSICAL EXAMINATION:  Vitals:   04/02/24 1210  BP: 137/75  Pulse: 88  Resp: 19  Temp: 97.8 F (36.6 C)  SpO2: 98%   Filed Weights   04/02/24 1210  Weight: 199 lb 3.2 oz (90.4 kg)    Physical Exam Constitutional:      General: He is not in acute distress. HENT:     Head: Normocephalic and atraumatic.  Eyes:     General: No scleral icterus. Cardiovascular:     Rate and Rhythm: Normal rate and regular rhythm.     Heart sounds: Normal heart sounds.  Pulmonary:     Effort: Pulmonary effort is normal. No respiratory distress.     Breath sounds: No wheezing.  Abdominal:     General: Bowel sounds are normal. There is no distension.     Palpations: Abdomen is soft.  Musculoskeletal:        General: No deformity. Normal range of motion.     Cervical back: Normal range of motion and neck supple.  Skin:    General: Skin is warm and dry.     Findings: No erythema or rash.  Neurological:     Mental Status: He is alert and oriented to person, place, and time. Mental status is at baseline.     Cranial Nerves: No cranial nerve deficit.     Coordination: Coordination normal.  Psychiatric:        Mood and Affect: Mood normal.     LABORATORY DATA:  I have reviewed the data as listed    Latest Ref Rng & Units 03/02/2024    9:43 AM 07/18/2020   12:38 PM 05/27/2014    8:17 PM  CBC  WBC 4.0 - 10.5 K/uL 6.8  9.6  15.2   Hemoglobin 13.0 - 17.0 g/dL 84.6  84.3  84.7   Hematocrit 39.0 - 52.0 % 44.7  45.6  46.0   Platelets 150 - 400 K/uL 238  248  284       Latest Ref Rng & Units 03/02/2024    9:43 AM 02/12/2024    4:18 PM 07/18/2020  12:38 PM  CMP  Glucose 70 - 99 mg/dL 856   842   BUN 8 - 23 mg/dL 25   27   Creatinine 9.38 - 1.24 mg/dL 8.92  8.69  8.87   Sodium 135 - 145 mmol/L 141   137   Potassium 3.5 - 5.1 mmol/L 3.7    3.8   Chloride 98 - 111 mmol/L 99   97   CO2 22 - 32 mmol/L 27   28   Calcium 8.9 - 10.3 mg/dL 9.2   9.7   Total Protein 6.5 - 8.1 g/dL 6.4     Total Bilirubin 0.0 - 1.2 mg/dL 0.5     Alkaline Phos 38 - 126 U/L 38     AST 15 - 41 U/L 23     ALT 0 - 44 U/L 31         RADIOGRAPHIC STUDIES: I have personally reviewed the radiological images as listed and agreed with the findings in the report. MR Abdomen W Wo Contrast Result Date: 02/19/2024 CLINICAL DATA:  Pancreatic cyst EXAM: MRI ABDOMEN WITHOUT AND WITH CONTRAST TECHNIQUE: Multiplanar multisequence MR imaging of the abdomen was performed both before and after the administration of intravenous contrast. CONTRAST:  9mL GADAVIST  GADOBUTROL  1 MMOL/ML IV SOLN COMPARISON:  CT abdomen pelvis, 02/12/2024, 07/18/2020 FINDINGS: Lower chest: No acute abnormality. Hepatobiliary: No solid liver abnormality is seen. Profound hepatic steatosis. Gallstone. No gallbladder wall thickening. No biliary ductal dilatation. Pancreas: Multiseptated cystic lesion in the dorsal pancreatic head measuring 2.1 x 2.1 x 1.6 cm (series 10, image 27, series 3, image 16). In retrospective review of examination dated 07/18/2020, this lesion was present and not significantly changed at that time measuring 2.1 x 2.0 x 1.6 cm when measured with similar technique. No pancreatic ductal dilatation or surrounding inflammatory changes. Spleen: Normal in size without significant abnormality. Adrenals/Urinary Tract: Adrenal glands are unremarkable. Kidneys are normal, without renal calculi, solid lesion, or hydronephrosis. Stomach/Bowel: Stomach is within normal limits. No evidence of bowel wall thickening, distention, or inflammatory changes. Descending colonic diverticulosis. Vascular/Lymphatic: Aortic atherosclerosis. No enlarged abdominal lymph nodes. Other: No abdominal wall hernia or abnormality. No ascites. Musculoskeletal: No acute or significant osseous findings. IMPRESSION: 1.  Multiseptated cystic lesion in the dorsal pancreatic head measuring 2.1 x 2.1 x 1.6 cm. In retrospective review of examination dated 07/18/2020, this lesion was present and not significantly changed at that time measuring 2.1 x 2.0 x 1.6 cm when measured with similar technique. 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 2. Profound hepatic steatosis. 3. Cholelithiasis. 4. Descending colonic diverticulosis. Aortic Atherosclerosis (ICD10-I70.0). Electronically Signed   By: Marolyn JONETTA Jaksch M.D.   On: 02/19/2024 17:39   MR 3D Recon At Scanner Result Date: 02/19/2024 CLINICAL DATA:  Pancreatic cyst EXAM: MRI ABDOMEN WITHOUT AND WITH CONTRAST TECHNIQUE: Multiplanar multisequence MR imaging of the abdomen was performed both before and after the administration of intravenous contrast. CONTRAST:  9mL GADAVIST  GADOBUTROL  1 MMOL/ML IV SOLN COMPARISON:  CT abdomen pelvis, 02/12/2024, 07/18/2020 FINDINGS: Lower chest: No acute abnormality. Hepatobiliary: No solid liver abnormality is seen. Profound hepatic steatosis. Gallstone. No gallbladder wall thickening. No biliary ductal dilatation. Pancreas: Multiseptated cystic lesion in the dorsal pancreatic head measuring 2.1 x 2.1 x 1.6 cm (series 10, image 27, series 3, image 16). In retrospective review of examination dated 07/18/2020, this lesion was present and not significantly  changed at that time measuring 2.1 x 2.0 x 1.6 cm when measured with similar technique. No pancreatic ductal dilatation or surrounding inflammatory changes. Spleen: Normal in size without significant abnormality. Adrenals/Urinary Tract: Adrenal glands are unremarkable. Kidneys are normal, without renal calculi, solid lesion, or hydronephrosis. Stomach/Bowel: Stomach is within normal limits. No evidence of bowel wall thickening, distention, or inflammatory changes. Descending  colonic diverticulosis. Vascular/Lymphatic: Aortic atherosclerosis. No enlarged abdominal lymph nodes. Other: No abdominal wall hernia or abnormality. No ascites. Musculoskeletal: No acute or significant osseous findings. IMPRESSION: 1. Multiseptated cystic lesion in the dorsal pancreatic head measuring 2.1 x 2.1 x 1.6 cm. In retrospective review of examination dated 07/18/2020, this lesion was present and not significantly changed at that time measuring 2.1 x 2.0 x 1.6 cm when measured with similar technique. 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 2. Profound hepatic steatosis. 3. Cholelithiasis. 4. Descending colonic diverticulosis. Aortic Atherosclerosis (ICD10-I70.0). Electronically Signed   By: Marolyn JONETTA Jaksch M.D.   On: 02/19/2024 17:39   CT ABDOMEN PELVIS W WO CONTRAST Result Date: 02/12/2024 CLINICAL DATA:  Abdominal/flank pain, stone suspected EXAM: CT ABDOMEN AND PELVIS WITHOUT AND WITH CONTRAST TECHNIQUE: Multidetector CT imaging of the abdomen and pelvis was performed following the standard protocol before and following the bolus administration of intravenous contrast. Pre and post-contrast imaging performed as specifically ordered. RADIATION DOSE REDUCTION: This exam was performed according to the departmental dose-optimization program which includes automated exposure control, adjustment of the mA and/or kV according to patient size and/or use of iterative reconstruction technique. CONTRAST:  OMNIPAQUE  IOHEXOL  300 MG/ML  SOLN COMPARISON:  Renal ultrasound 02/04/2024. Noncontrast abdominopelvic CT 07/18/2020. FINDINGS: Lower chest: Mild scarring at both lung bases. Atherosclerosis of the aorta and coronary arteries. Hepatobiliary: Severe hepatic steatosis. No focal lesion or abnormal enhancement identified. There is a peripherally calcified 2.7 cm gallstone. No  evidence of gallbladder wall thickening, surrounding inflammation or biliary ductal dilatation. Pancreas: Ill-defined low-density in the uncinate process, measuring approximately 2.2 x 1.2 cm on image 39/7. Although not grossly changed when comparing today's noncontrast images with the previous noncontrast study, this lesion may be slightly larger. The pancreas otherwise appears unremarkable. No pancreatic ductal dilatation or surrounding inflammation. Spleen: Normal in size without focal abnormality. Adrenals/Urinary Tract: Both adrenal glands appear normal. Punctate nonobstructing bilateral renal calculi. No evidence of ureteral calculus, hydronephrosis or perinephric soft tissue stranding. The left ureter is partially duplicated proximally. Small renal cortical cysts bilaterally, measuring up to 1.5 cm in the interpolar region of the left kidney. Small renal sinus cysts bilaterally. No specific follow-up imaging of these renal cysts is recommended. The bladder appears unremarkable for its degree of distention. Stomach/Bowel: No enteric contrast administered. The stomach appears unremarkable for its degree of distension. No evidence of bowel wall thickening, distention or surrounding inflammatory change. Suspected previous appendectomy. Mild distal colonic diverticulosis. Vascular/Lymphatic: There are no enlarged abdominal or pelvic lymph nodes. Mild aortic and branch vessel atherosclerosis. No evidence of aneurysm or large vessel occlusion. Reproductive: Mild enlargement of the prostate gland. Other: Stable prominent fat in both inguinal canals. No ascites or pneumoperitoneum. Musculoskeletal: No acute or significant osseous findings. Mild multilevel spondylosis. IMPRESSION: 1. No acute findings or explanation for the patient's symptoms. 2. Punctate nonobstructing bilateral renal calculi. No evidence of ureteral calculus or hydronephrosis. 3. Ill-defined low-density lesion in the uncinate process of the pancreas,  possibly slightly  larger than on the previous noncontrast study. This could reflect a cystic pancreatic neoplasm. Recommend further evaluation with non emergent abdominal MRI without and with contrast. 4. Severe hepatic steatosis. 5. Cholelithiasis without evidence of cholecystitis or biliary ductal dilatation. 6.  Aortic Atherosclerosis (ICD10-I70.0). Electronically Signed   By: Elsie Perone M.D.   On: 02/12/2024 16:52   US  RENAL Result Date: 02/12/2024 CLINICAL DATA:  Acute right flank EXAM: RENAL / URINARY TRACT ULTRASOUND COMPLETE COMPARISON:  CT abdomen and pelvis dated 07/18/2020 FINDINGS: Right Kidney: Length = 11.7 cm Lobulated renal contour. Normal parenchymal echogenicity with preserved corticomedullary differentiation. Upper pole parapelvic cyst measures 2.2 cm. No urinary tract dilation or shadowing calculi. The ureter is not seen. Left Kidney: Length = 13.2 cm Normal parenchymal echogenicity with preserved corticomedullary differentiation. Lower pole parapelvic cyst measures 1.2 cm. Additional exophytic interpolar cyst measures 1.6 cm. No urinary tract dilation or shadowing calculi. The ureter is not seen. Bladder: Trabeculated urinary bladder. Other: None. IMPRESSION: 1. No urinary tract dilation or shadowing calculi. 2. Trabeculated urinary bladder, which can be seen in the setting of chronic bladder outlet obstruction. 3. Bilateral renal cysts, as described. Lobulated right renal contour, likely related to underlying cysts, however a CT of the abdomen and pelvis is recommended for further evaluation of underlying renal lesion and possible calculi not appreciated by ultrasound evaluation. Electronically Signed   By: Limin  Xu M.D.   On: 02/12/2024 08:30

## 2024-04-03 NOTE — Assessment & Plan Note (Signed)
 Normal LFT.  He has an appointment with [facility] hepatology.  Recommend healthy diet and exercise

## 2024-04-03 NOTE — Assessment & Plan Note (Signed)
 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 Recommend further work up with EUS and CNA biopsy for cyst fluid analysis. Refer to GI

## 2024-04-05 NOTE — Telephone Encounter (Signed)
 Called and left message patient.

## 2024-04-06 NOTE — Telephone Encounter (Signed)
 Left message for patient

## 2024-04-07 ENCOUNTER — Other Ambulatory Visit: Payer: Self-pay

## 2024-04-07 ENCOUNTER — Other Ambulatory Visit

## 2024-04-07 DIAGNOSIS — K862 Cyst of pancreas: Secondary | ICD-10-CM

## 2024-04-07 DIAGNOSIS — K869 Disease of pancreas, unspecified: Secondary | ICD-10-CM

## 2024-04-07 DIAGNOSIS — R935 Abnormal findings on diagnostic imaging of other abdominal regions, including retroperitoneum: Secondary | ICD-10-CM

## 2024-04-07 NOTE — Addendum Note (Signed)
 Addended by: SUELLEN PEERS on: 04/07/2024 04:47 PM   Modules accepted: Orders

## 2024-04-07 NOTE — Telephone Encounter (Signed)
 Patient has been scheduled for office visit 05/12/24 @ 3:50 pm with Dr Wilhel[place]ia. Patient has been scheduled for 05/24/24 @ [facility] for EUS. All instructions will be mailed to patient. Patient will come in for CA 19-9 prior to appointment, but understands that he can also wait until his visit.

## 2024-04-07 NOTE — Progress Notes (Signed)
 aa

## 2024-04-08 LAB — CANCER ANTIGEN 19-9: CA 19-9: 97 U/mL — ABNORMAL HIGH (ref <34)

## 2024-04-09 ENCOUNTER — Ambulatory Visit: Payer: Self-pay | Admitting: Gastroenterology

## 2024-04-16 ENCOUNTER — Telehealth: Payer: Self-pay

## 2024-04-16 NOTE — Telephone Encounter (Signed)
 Patient's wife requesting to know if 11/12 office visit should be kept before 11/24 procedure. Please advise, thank you

## 2024-04-16 NOTE — Telephone Encounter (Signed)
 The pt has been advised to keep appt for office visit as well as EUS.  The pt has been advised of the information and verbalized understanding.

## 2024-05-05 ENCOUNTER — Inpatient Hospital Stay

## 2024-05-12 ENCOUNTER — Ambulatory Visit: Admitting: Gastroenterology

## 2024-05-12 ENCOUNTER — Encounter: Payer: Self-pay | Admitting: Gastroenterology

## 2024-05-12 VITALS — BP 120/80 | HR 84 | Ht 67.25 in | Wt 199.2 lb

## 2024-05-12 DIAGNOSIS — R7989 Other specified abnormal findings of blood chemistry: Secondary | ICD-10-CM

## 2024-05-12 DIAGNOSIS — K760 Fatty (change of) liver, not elsewhere classified: Secondary | ICD-10-CM

## 2024-05-12 DIAGNOSIS — R935 Abnormal findings on diagnostic imaging of other abdominal regions, including retroperitoneum: Secondary | ICD-10-CM

## 2024-05-12 DIAGNOSIS — K862 Cyst of pancreas: Secondary | ICD-10-CM

## 2024-05-12 DIAGNOSIS — R978 Other abnormal tumor markers: Secondary | ICD-10-CM

## 2024-05-12 DIAGNOSIS — K869 Disease of pancreas, unspecified: Secondary | ICD-10-CM | POA: Diagnosis not present

## 2024-05-12 NOTE — H&P (View-Only) (Signed)
 GASTROENTEROLOGY OUTPATIENT CLINIC VISIT   Primary Care Provider Liana Fish, NP [address] [place] [address] [phone]  Referring Provider Liana Fish, NP [address] [place],  [place] [address] [phone]  Patient Profile: Joshua Lane is a 78 y.o. male with a pmh significant for diabetes, hypertension, skin cancers, previous tobacco use, previous alcohol use, hepatic steatosis (imaging based), cholelithiasis, diverticulosis, pancreatic cyst.  The patient presents to the [facility] for an evaluation and management of problem(s) noted below:  Problem List 1. Pancreatic cyst   2. Pancreatic lesion   3. Hepatic steatosis   4. Abnormal MRI of abdomen   5. High serum carbohydrate antigen 19-9   6. Elevated CA 19-9 level     History of Present Illness This is the patient's first visit to the outpatient [place] [facility] clinic.  He is accompanied by his wife today.  In August, the patient underwent imaging of his kidneys which found a lesion in the pancreas which subsequently led to a CT scan and then MRI abdomen.  A CA 19-9 was drawn and found to be elevated.  Patient was noted to have a pancreatic cyst in the head region that actually had been seen and very similar in size to 3 years prior on imaging though patient was not aware of that.  He describes no history of pancreatitis.  He describes no family history of pancreatic cancer.  No GI malignancies in his family.  He does not regularly have abdominal pain or discomfort.  He no longer smokes or drinks over the last few months.  He continues to work though is worried about the risks of potential endoscopic ultrasound evaluation and sampling.  He describes previous Cologuard use in the last few years but we do not have access to those records.  On his CT scan he also was found to have hepatic steatosis, and he has been referred to [facility] for further evaluation of this still recent liver enzymes have  been normal.  GI Review of Systems Positive as above Negative for odynophagia, dysphagia, nausea, vomiting, pain, melena, hematochezia  Review of Systems General: Denies fevers/chills/weight loss unintentionally Cardiovascular: Denies chest pain Pulmonary: Denies shortness of breath Gastroenterological: See HPI Genitourinary: Denies darkened urine Hematological: Denies easy bruising/bleeding Dermatological: Denies jaundice Psychological: Mood is stable though somewhat anxious about the procedure itself  Medications Current Outpatient Medications  Medication Sig Dispense Refill   Accu-Chek Softclix Lancets lancets Use as instructed once daily Dx E11.65 100 each 1   amLODipine  (NORVASC ) 10 MG tablet Take 1 tablet (10 mg total) by mouth daily. 90 tablet 3   aspirin EC 81 MG tablet Take 81 mg by mouth daily.     empagliflozin  (JARDIANCE ) 25 MG TABS tablet Take 1 tablet (25 mg total) by mouth daily. 90 tablet 3   glipiZIDE  (GLUCOTROL  XL) 2.5 MG 24 hr tablet Take 1 tablet (2.5 mg total) by mouth daily. 90 tablet 3   glucose blood (ACCU-CHEK GUIDE TEST) test strip Use 1 test strip to check glucose once daily and as needed for diabetes E11.65 100 each 12   hydrocortisone  2.5 % lotion Apply topically as directed. Apply to face three times per week- Monday, Wednesday, Friday 118 mL 11   ketoconazole  (NIZORAL ) 2 % shampoo Apply 1 Application topically 3 (three) times a week. Wash face, scalp, ears daily. 120 mL 11   losartan -hydrochlorothiazide (HYZAAR) 100-25 MG tablet Take 1 tablet by mouth daily. 90 tablet 3   meloxicam (MOBIC) 7.5  MG tablet TAKE 1 TABLET EVERY DAY BY ORAL ROUTE AS NEEDED.     metFORMIN  (GLUCOPHAGE ) 500 MG tablet Take 2 tablets (1,000 mg total) by mouth 2 (two) times daily. 120 tablet 3   methocarbamol  (ROBAXIN ) 500 MG tablet Take 1-2 tablets (500-1,000 mg total) by mouth every 6 (six) hours as needed for muscle spasms. 120 tablet 2   omeprazole (PRILOSEC) 20 MG capsule Take  20 mg by mouth daily.     pravastatin  (PRAVACHOL ) 20 MG tablet Take 1 tablet (20 mg total) by mouth daily. 90 tablet 3   tamsulosin (FLOMAX) 0.4 MG CAPS capsule Take 0.4 mg by mouth daily.     No current facility-administered medications for this visit.    Allergies Allergies  Allergen Reactions   Penicillins     REACTION: ?    Histories Past Medical History:  Diagnosis Date   Actinic keratosis    Aortic atherosclerosis    Basal cell carcinoma 09/20/2020   L temple MOHs 01/05/2021   Cholelithiasis    Diabetes mellitus without complication (HCC)    Diverticulosis    Hepatic steatosis    Hypertension    Pancreatic cyst    Smoker 09/10/2016   Type 2 diabetes mellitus with hyperosmolarity without coma, without Griffith-term current use of insulin (HCC) 05/15/2015   Past Surgical History:  Procedure Laterality Date   APPENDECTOMY     BROW LIFT Bilateral 08/03/2020   Procedure: BLEPHAROPLASTY UPPER EYELID WIT EXCESS SKIN DIABETIC;  Surgeon: Ashley Greig HERO, MD;  Location: [facility];  Service: Ophthalmology;  Laterality: Bilateral;  Diabetic   NECK SURGERY     Social History   Socioeconomic History   Marital status: Married    Spouse name: Not on file   Number of children: Not on file   Years of education: Not on file   Highest education level: Not on file  Occupational History   Not on file  Tobacco Use   Smoking status: Former    Current packs/day: 0.00    Average packs/day: 1 pack/day for 55.0 years (55.0 ttl pk-yrs)    Types: Cigarettes    Start date: 06/20/1949    Quit date: 06/19/2006    Years since quitting: 17.9   Smokeless tobacco: Current    Types: Snuff  Vaping Use   Vaping status: Never Used  Substance and Sexual Activity   Alcohol use: Not Currently    Comment: Drinks 1 beer or mixed drink every other night.    Drug use: No   Sexual activity: Not Currently    Birth control/protection: None  Other Topics Concern   Not on file  Social History  Narrative   Not on file   Social Drivers of Health   Financial Resource Strain: Not on file  Food Insecurity: No Food Insecurity (04/02/2024)   Hunger Vital Sign    Worried About Running Out of Food in the Last Year: Never true    Ran Out of Food in the Last Year: Never true  Transportation Needs: No Transportation Needs (04/02/2024)   PRAPARE - Administrator, Civil Service (Medical): No    Lack of Transportation (Non-Medical): No  Physical Activity: Not on file  Stress: Not on file  Social Connections: Not on file  Intimate Partner Violence: Not At Risk (04/02/2024)   Humiliation, Afraid, Rape, and Kick questionnaire    Fear of Current or Ex-Partner: No    Emotionally Abused: No    Physically Abused: No  Sexually Abused: No   Family History  Problem Relation Age of Onset   Diabetes Mother    Colon cancer Neg Hx    Esophageal cancer Neg Hx    Inflammatory bowel disease Neg Hx    Liver disease Neg Hx    Pancreatic cancer Neg Hx    Rectal cancer Neg Hx    Stomach cancer Neg Hx    I have reviewed his medical, social, and family history in detail and updated the electronic medical record as necessary.    PHYSICAL EXAMINATION  BP 120/80 (BP Location: Left Arm, Patient Position: Sitting, Cuff Size: Normal)   Pulse 84   Ht 5' 7.25 (1.708 m) Comment: height measured without shoes  Wt 199 lb 4 oz (90.4 kg)   BMI 30.98 kg/m  Wt Readings from Last 3 Encounters:  05/12/24 199 lb 4 oz (90.4 kg)  04/02/24 199 lb 3.2 oz (90.4 kg)  03/31/24 200 lb 12.8 oz (91.1 kg)  GEN: NAD, appears stated age, doesn't appear chronically ill, accompanied by wife PSYCH: Cooperative, without pressured speech EYE: Conjunctivae pink, sclerae anicteric ENT: MMM CV: Nontachycardic RESP: No audible wheezing GI: NABS, soft, protuberant abdomen, rounded, nontender, without rebound or guarding, no HSM appreciated MSK/EXT: No significant lower extremity edema SKIN: No jaundice, no spider  angiomata NEURO:  Alert & Oriented x 3, no focal deficits, no evidence of asterixis   REVIEW OF DATA  I reviewed the following data at the time of this encounter:  GI Procedures and Studies  Patient reports Cologuard negative within the last 2 years  Laboratory Studies  Fib-4 score = 1.32  Imaging Studies  August 2025 MRI abdomen IMPRESSION: 1. Multiseptated cystic lesion in the dorsal pancreatic head measuring 2.1 x 2.1 x 1.6 cm. In retrospective review of examination dated 07/18/2020, this lesion was present and not significantly changed at that time measuring 2.1 x 2.0 x 1.6 cm when measured with similar technique. 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 2. Profound hepatic steatosis. 3. Cholelithiasis. 4. Descending colonic diverticulosis.  August 2025 CTAP with contrast IMPRESSION: 1. No acute findings or explanation for the patient's symptoms. 2. Punctate nonobstructing bilateral renal calculi. No evidence of ureteral calculus or hydronephrosis. 3. Ill-defined low-density lesion in the uncinate process of the pancreas, possibly slightly larger than on the previous noncontrast study. This could reflect a cystic pancreatic neoplasm. Recommend further evaluation with non emergent abdominal MRI without and with contrast. 4. Severe hepatic steatosis. 5. Cholelithiasis without evidence of cholecystitis or biliary ductal dilatation. 6.  Aortic Atherosclerosis (ICD10-I70.0).      ASSESSMENT  Joshua Lane is a 78 y.o. male.  The patient is seen today for evaluation and management of:  1. Pancreatic cyst   2. Pancreatic lesion   3. Hepatic steatosis   4. Abnormal MRI of abdomen   5. High serum carbohydrate antigen 19-9   6. Elevated CA 19-9 level    The patient is clinically and hemodynamically stable at this time.  In regards to his  pancreatic cyst and elevated CA 19-9, I think endoscopic ultrasound remains the plan of action that is most ideal.  We did discuss, due to some hesitancy, other options in regards to not pursuing any further evaluation or at least pursuing repeat imaging.  After extensive discussion, the patient would move forward with endoscopic ultrasound which we have scheduled.  The risks  of an EUS including intestinal perforation, bleeding, infection, aspiration, and medication effects were discussed as was the possibility it may not give a definitive diagnosis if a biopsy is performed.  When a biopsy of the pancreas is done as part of the EUS, there is an additional risk of pancreatitis at the rate of about 1-2%.  It was explained that procedure related pancreatitis is typically mild, although it can be severe and even life threatening, which is why we do not perform random pancreatic biopsies and only biopsy a lesion/area we feel is concerning enough to warrant the risk.  The risks of an EUS including intestinal perforation, bleeding, infection, aspiration, and medication effects were discussed as was the possibility it may not give a definitive diagnosis if a biopsy is performed.  When a biopsy of the pancreas is done as part of the EUS, there is an additional risk of pancreatitis at the rate of about 1-2%.  It was explained that procedure related pancreatitis is typically mild, although it can be severe and even life threatening, which is why we do not perform random pancreatic biopsies and only biopsy a lesion/area we feel is concerning enough to warrant the risk.  In regards to patient's hepatic steatosis noted on imaging he has a low Fib-4 score, he would benefit from FibroScan imaging or liver elastography.  He wants to get through the pancreatic cyst first to decide next steps from there.  He has a [facility] GI clinic evaluation in December.  He can keep that for now.  Will do some basic laboratories in the interim at the  time of his procedure he can have these drawn.  For now he will continue on his current medications and work on weight loss.  No plan for Rezdiffra at this time.  We will try to obtain his Cologuard findings as well that he states he has had over the last couple of years.   PLAN  Proceed with upper EUS as already scheduled - Will help hopefully determine the etiology of elevated CA 19-9 as well Laboratories as outlined below to be drawn on day of procedure Liver elastography or FibroScan should be considered for this patient Patient to maintain clinic visit with [facility] liver/GI for now in December Work on weight loss in setting of what appears to be hepatic steatosis Holding on Rezdiffra unless [facility] feels necessary   Orders Placed This Encounter  Procedures   Hepatitis B Surface AntiBODY   Hepatitis A Ab, Total   Hepatitis B Core Antibody, total   Hepatitis B Surface AntiGEN   Hepatitis C antibody    New Prescriptions   No medications on file   Modified Medications   No medications on file    Planned Follow Up No follow-ups on file.   Total Time in Face-to-Face and in Coordination of Care for patient including independent/personal interpretation/review of prior testing, medical history, examination, medication adjustment, communicating results with the patient directly, and documentation within the EHR is 45 minutes.   Aloha Finner, MD  [facility] Office # [phone]

## 2024-05-12 NOTE — Progress Notes (Unsigned)
 GASTROENTEROLOGY OUTPATIENT CLINIC VISIT   Primary Care Provider Liana Fish, NP [address] [place] [address] [phone]  Referring Provider Liana Fish, NP [address] [place],  [place] [address] [phone]  Patient Profile: Joshua Lane is a 78 y.o. male with a pmh significant for diabetes, hypertension, skin cancers, previous tobacco use, previous alcohol use, hepatic steatosis (imaging based), cholelithiasis, diverticulosis, pancreatic cyst.  The patient presents to the [facility] for an evaluation and management of problem(s) noted below:  Problem List 1. Pancreatic cyst   2. Pancreatic lesion   3. Hepatic steatosis   4. Abnormal MRI of abdomen   5. High serum carbohydrate antigen 19-9   6. Elevated CA 19-9 level     History of Present Illness This is the patient's first visit to the outpatient [place] [facility] clinic.  He is accompanied by his wife today.  In August, the patient underwent imaging of his kidneys which found a lesion in the pancreas which subsequently led to a CT scan and then MRI abdomen.  A CA 19-9 was drawn and found to be elevated.  Patient was noted to have a pancreatic cyst in the head region that actually had been seen and very similar in size to 3 years prior on imaging though patient was not aware of that.  He describes no history of pancreatitis.  He describes no family history of pancreatic cancer.  No GI malignancies in his family.  He does not regularly have abdominal pain or discomfort.  He no longer smokes or drinks over the last few months.  He continues to work though is worried about the risks of potential endoscopic ultrasound evaluation and sampling.  He describes previous Cologuard use in the last few years but we do not have access to those records.  On his CT scan he also was found to have hepatic steatosis, and he has been referred to [facility] for further evaluation of this still recent liver enzymes have  been normal.  GI Review of Systems Positive as above Negative for odynophagia, dysphagia, nausea, vomiting, pain, melena, hematochezia  Review of Systems General: Denies fevers/chills/weight loss unintentionally Cardiovascular: Denies chest pain Pulmonary: Denies shortness of breath Gastroenterological: See HPI Genitourinary: Denies darkened urine Hematological: Denies easy bruising/bleeding Dermatological: Denies jaundice Psychological: Mood is stable though somewhat anxious about the procedure itself  Medications Current Outpatient Medications  Medication Sig Dispense Refill   Accu-Chek Softclix Lancets lancets Use as instructed once daily Dx E11.65 100 each 1   amLODipine  (NORVASC ) 10 MG tablet Take 1 tablet (10 mg total) by mouth daily. 90 tablet 3   aspirin EC 81 MG tablet Take 81 mg by mouth daily.     empagliflozin  (JARDIANCE ) 25 MG TABS tablet Take 1 tablet (25 mg total) by mouth daily. 90 tablet 3   glipiZIDE  (GLUCOTROL  XL) 2.5 MG 24 hr tablet Take 1 tablet (2.5 mg total) by mouth daily. 90 tablet 3   glucose blood (ACCU-CHEK GUIDE TEST) test strip Use 1 test strip to check glucose once daily and as needed for diabetes E11.65 100 each 12   hydrocortisone  2.5 % lotion Apply topically as directed. Apply to face three times per week- Monday, Wednesday, Friday 118 mL 11   ketoconazole  (NIZORAL ) 2 % shampoo Apply 1 Application topically 3 (three) times a week. Wash face, scalp, ears daily. 120 mL 11   losartan -hydrochlorothiazide (HYZAAR) 100-25 MG tablet Take 1 tablet by mouth daily. 90 tablet 3   meloxicam (MOBIC) 7.5  MG tablet TAKE 1 TABLET EVERY DAY BY ORAL ROUTE AS NEEDED.     metFORMIN  (GLUCOPHAGE ) 500 MG tablet Take 2 tablets (1,000 mg total) by mouth 2 (two) times daily. 120 tablet 3   methocarbamol  (ROBAXIN ) 500 MG tablet Take 1-2 tablets (500-1,000 mg total) by mouth every 6 (six) hours as needed for muscle spasms. 120 tablet 2   omeprazole (PRILOSEC) 20 MG capsule Take  20 mg by mouth daily.     pravastatin  (PRAVACHOL ) 20 MG tablet Take 1 tablet (20 mg total) by mouth daily. 90 tablet 3   tamsulosin (FLOMAX) 0.4 MG CAPS capsule Take 0.4 mg by mouth daily.     No current facility-administered medications for this visit.    Allergies Allergies  Allergen Reactions   Penicillins     REACTION: ?    Histories Past Medical History:  Diagnosis Date   Actinic keratosis    Aortic atherosclerosis    Basal cell carcinoma 09/20/2020   L temple MOHs 01/05/2021   Cholelithiasis    Diabetes mellitus without complication (HCC)    Diverticulosis    Hepatic steatosis    Hypertension    Pancreatic cyst    Smoker 09/10/2016   Type 2 diabetes mellitus with hyperosmolarity without coma, without Griffith-term current use of insulin (HCC) 05/15/2015   Past Surgical History:  Procedure Laterality Date   APPENDECTOMY     BROW LIFT Bilateral 08/03/2020   Procedure: BLEPHAROPLASTY UPPER EYELID WIT EXCESS SKIN DIABETIC;  Surgeon: Ashley Greig HERO, MD;  Location: [facility];  Service: Ophthalmology;  Laterality: Bilateral;  Diabetic   NECK SURGERY     Social History   Socioeconomic History   Marital status: Married    Spouse name: Not on file   Number of children: Not on file   Years of education: Not on file   Highest education level: Not on file  Occupational History   Not on file  Tobacco Use   Smoking status: Former    Current packs/day: 0.00    Average packs/day: 1 pack/day for 55.0 years (55.0 ttl pk-yrs)    Types: Cigarettes    Start date: 06/20/1949    Quit date: 06/19/2006    Years since quitting: 17.9   Smokeless tobacco: Current    Types: Snuff  Vaping Use   Vaping status: Never Used  Substance and Sexual Activity   Alcohol use: Not Currently    Comment: Drinks 1 beer or mixed drink every other night.    Drug use: No   Sexual activity: Not Currently    Birth control/protection: None  Other Topics Concern   Not on file  Social History  Narrative   Not on file   Social Drivers of Health   Financial Resource Strain: Not on file  Food Insecurity: No Food Insecurity (04/02/2024)   Hunger Vital Sign    Worried About Running Out of Food in the Last Year: Never true    Ran Out of Food in the Last Year: Never true  Transportation Needs: No Transportation Needs (04/02/2024)   PRAPARE - Administrator, Civil Service (Medical): No    Lack of Transportation (Non-Medical): No  Physical Activity: Not on file  Stress: Not on file  Social Connections: Not on file  Intimate Partner Violence: Not At Risk (04/02/2024)   Humiliation, Afraid, Rape, and Kick questionnaire    Fear of Current or Ex-Partner: No    Emotionally Abused: No    Physically Abused: No  Sexually Abused: No   Family History  Problem Relation Age of Onset   Diabetes Mother    Colon cancer Neg Hx    Esophageal cancer Neg Hx    Inflammatory bowel disease Neg Hx    Liver disease Neg Hx    Pancreatic cancer Neg Hx    Rectal cancer Neg Hx    Stomach cancer Neg Hx    I have reviewed his medical, social, and family history in detail and updated the electronic medical record as necessary.    PHYSICAL EXAMINATION  BP 120/80 (BP Location: Left Arm, Patient Position: Sitting, Cuff Size: Normal)   Pulse 84   Ht 5' 7.25 (1.708 m) Comment: height measured without shoes  Wt 199 lb 4 oz (90.4 kg)   BMI 30.98 kg/m  Wt Readings from Last 3 Encounters:  05/12/24 199 lb 4 oz (90.4 kg)  04/02/24 199 lb 3.2 oz (90.4 kg)  03/31/24 200 lb 12.8 oz (91.1 kg)  GEN: NAD, appears stated age, doesn't appear chronically ill, accompanied by wife PSYCH: Cooperative, without pressured speech EYE: Conjunctivae pink, sclerae anicteric ENT: MMM CV: Nontachycardic RESP: No audible wheezing GI: NABS, soft, protuberant abdomen, rounded, nontender, without rebound or guarding, no HSM appreciated MSK/EXT: No significant lower extremity edema SKIN: No jaundice, no spider  angiomata NEURO:  Alert & Oriented x 3, no focal deficits, no evidence of asterixis   REVIEW OF DATA  I reviewed the following data at the time of this encounter:  GI Procedures and Studies  Patient reports Cologuard negative within the last 2 years  Laboratory Studies  Fib-4 score = 1.32  Imaging Studies  August 2025 MRI abdomen IMPRESSION: 1. Multiseptated cystic lesion in the dorsal pancreatic head measuring 2.1 x 2.1 x 1.6 cm. In retrospective review of examination dated 07/18/2020, this lesion was present and not significantly changed at that time measuring 2.1 x 2.0 x 1.6 cm when measured with similar technique. 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 2. Profound hepatic steatosis. 3. Cholelithiasis. 4. Descending colonic diverticulosis.  August 2025 CTAP with contrast IMPRESSION: 1. No acute findings or explanation for the patient's symptoms. 2. Punctate nonobstructing bilateral renal calculi. No evidence of ureteral calculus or hydronephrosis. 3. Ill-defined low-density lesion in the uncinate process of the pancreas, possibly slightly larger than on the previous noncontrast study. This could reflect a cystic pancreatic neoplasm. Recommend further evaluation with non emergent abdominal MRI without and with contrast. 4. Severe hepatic steatosis. 5. Cholelithiasis without evidence of cholecystitis or biliary ductal dilatation. 6.  Aortic Atherosclerosis (ICD10-I70.0).      ASSESSMENT  Joshua Lane is a 78 y.o. male.  The patient is seen today for evaluation and management of:  1. Pancreatic cyst   2. Pancreatic lesion   3. Hepatic steatosis   4. Abnormal MRI of abdomen   5. High serum carbohydrate antigen 19-9   6. Elevated CA 19-9 level    The patient is clinically and hemodynamically stable at this time.  In regards to his  pancreatic cyst and elevated CA 19-9, I think endoscopic ultrasound remains the plan of action that is most ideal.  We did discuss, due to some hesitancy, other options in regards to not pursuing any further evaluation or at least pursuing repeat imaging.  After extensive discussion, the patient would move forward with endoscopic ultrasound which we have scheduled.  The risks  of an EUS including intestinal perforation, bleeding, infection, aspiration, and medication effects were discussed as was the possibility it may not give a definitive diagnosis if a biopsy is performed.  When a biopsy of the pancreas is done as part of the EUS, there is an additional risk of pancreatitis at the rate of about 1-2%.  It was explained that procedure related pancreatitis is typically mild, although it can be severe and even life threatening, which is why we do not perform random pancreatic biopsies and only biopsy a lesion/area we feel is concerning enough to warrant the risk.  The risks of an EUS including intestinal perforation, bleeding, infection, aspiration, and medication effects were discussed as was the possibility it may not give a definitive diagnosis if a biopsy is performed.  When a biopsy of the pancreas is done as part of the EUS, there is an additional risk of pancreatitis at the rate of about 1-2%.  It was explained that procedure related pancreatitis is typically mild, although it can be severe and even life threatening, which is why we do not perform random pancreatic biopsies and only biopsy a lesion/area we feel is concerning enough to warrant the risk.  In regards to patient's hepatic steatosis noted on imaging he has a low Fib-4 score, he would benefit from FibroScan imaging or liver elastography.  He wants to get through the pancreatic cyst first to decide next steps from there.  He has a [facility] GI clinic evaluation in December.  He can keep that for now.  Will do some basic laboratories in the interim at the  time of his procedure he can have these drawn.  For now he will continue on his current medications and work on weight loss.  No plan for Rezdiffra at this time.  We will try to obtain his Cologuard findings as well that he states he has had over the last couple of years.   PLAN  Proceed with upper EUS as already scheduled - Will help hopefully determine the etiology of elevated CA 19-9 as well Laboratories as outlined below to be drawn on day of procedure Liver elastography or FibroScan should be considered for this patient Patient to maintain clinic visit with [facility] liver/GI for now in December Work on weight loss in setting of what appears to be hepatic steatosis Holding on Rezdiffra unless [facility] feels necessary   Orders Placed This Encounter  Procedures   Hepatitis B Surface AntiBODY   Hepatitis A Ab, Total   Hepatitis B Core Antibody, total   Hepatitis B Surface AntiGEN   Hepatitis C antibody    New Prescriptions   No medications on file   Modified Medications   No medications on file    Planned Follow Up No follow-ups on file.   Total Time in Face-to-Face and in Coordination of Care for patient including independent/personal interpretation/review of prior testing, medical history, examination, medication adjustment, communicating results with the patient directly, and documentation within the EHR is 45 minutes.   Aloha Finner, MD  [facility] Office # [phone]

## 2024-05-14 ENCOUNTER — Encounter: Payer: Self-pay | Admitting: Gastroenterology

## 2024-05-14 DIAGNOSIS — K869 Disease of pancreas, unspecified: Secondary | ICD-10-CM | POA: Insufficient documentation

## 2024-05-14 DIAGNOSIS — R935 Abnormal findings on diagnostic imaging of other abdominal regions, including retroperitoneum: Secondary | ICD-10-CM | POA: Insufficient documentation

## 2024-05-14 DIAGNOSIS — K760 Fatty (change of) liver, not elsewhere classified: Secondary | ICD-10-CM | POA: Insufficient documentation

## 2024-05-14 DIAGNOSIS — R7989 Other specified abnormal findings of blood chemistry: Secondary | ICD-10-CM | POA: Insufficient documentation

## 2024-05-17 ENCOUNTER — Telehealth: Payer: Self-pay | Admitting: Gastroenterology

## 2024-05-17 NOTE — Telephone Encounter (Addendum)
 Procedure:Upper EUS Procedure date: 05/24/24 Procedure location: [facility] Arrival Time: 11:15 am Spoke with the patient Y/N: Yes, spoke with wife Any prep concerns? No  Has the patient obtained the prep from the pharmacy ? No prep needed Do you have a care partner and transportation: Yes Any additional concerns? No

## 2024-05-18 ENCOUNTER — Encounter (HOSPITAL_COMMUNITY): Payer: Self-pay | Admitting: Gastroenterology

## 2024-05-18 NOTE — Progress Notes (Signed)
 Attempted to obtain medical history for pre op call via telephone, unable to reach at this time. HIPAA compliant voicemail message left requesting return call to pre surgical testing department.

## 2024-05-24 ENCOUNTER — Ambulatory Visit (HOSPITAL_COMMUNITY): Admitting: Anesthesiology

## 2024-05-24 ENCOUNTER — Ambulatory Visit (HOSPITAL_COMMUNITY)
Admission: RE | Admit: 2024-05-24 | Discharge: 2024-05-24 | Disposition: A | Discharge status: Home / Self Care | Source: Ambulatory Visit | Attending: Gastroenterology | Admitting: Gastroenterology

## 2024-05-24 ENCOUNTER — Encounter (HOSPITAL_COMMUNITY)
Admission: RE | Disposition: A | Discharge status: Home / Self Care | Payer: Self-pay | Source: Ambulatory Visit | Attending: Gastroenterology

## 2024-05-24 ENCOUNTER — Other Ambulatory Visit: Payer: Self-pay

## 2024-05-24 ENCOUNTER — Encounter (HOSPITAL_COMMUNITY): Payer: Self-pay | Admitting: Gastroenterology

## 2024-05-24 DIAGNOSIS — Z7984 Long term (current) use of oral hypoglycemic drugs: Secondary | ICD-10-CM | POA: Diagnosis not present

## 2024-05-24 DIAGNOSIS — I10 Essential (primary) hypertension: Secondary | ICD-10-CM | POA: Diagnosis not present

## 2024-05-24 DIAGNOSIS — K8689 Other specified diseases of pancreas: Secondary | ICD-10-CM | POA: Diagnosis not present

## 2024-05-24 DIAGNOSIS — K8020 Calculus of gallbladder without cholecystitis without obstruction: Secondary | ICD-10-CM | POA: Insufficient documentation

## 2024-05-24 DIAGNOSIS — K2980 Duodenitis without bleeding: Secondary | ICD-10-CM

## 2024-05-24 DIAGNOSIS — K31819 Angiodysplasia of stomach and duodenum without bleeding: Secondary | ICD-10-CM | POA: Diagnosis not present

## 2024-05-24 DIAGNOSIS — K317 Polyp of stomach and duodenum: Secondary | ICD-10-CM | POA: Diagnosis not present

## 2024-05-24 DIAGNOSIS — E119 Type 2 diabetes mellitus without complications: Secondary | ICD-10-CM | POA: Insufficient documentation

## 2024-05-24 DIAGNOSIS — K3189 Other diseases of stomach and duodenum: Secondary | ICD-10-CM | POA: Insufficient documentation

## 2024-05-24 DIAGNOSIS — K862 Cyst of pancreas: Secondary | ICD-10-CM | POA: Insufficient documentation

## 2024-05-24 DIAGNOSIS — Z87891 Personal history of nicotine dependence: Secondary | ICD-10-CM

## 2024-05-24 DIAGNOSIS — K2970 Gastritis, unspecified, without bleeding: Secondary | ICD-10-CM

## 2024-05-24 DIAGNOSIS — Z85828 Personal history of other malignant neoplasm of skin: Secondary | ICD-10-CM | POA: Diagnosis not present

## 2024-05-24 DIAGNOSIS — R978 Other abnormal tumor markers: Secondary | ICD-10-CM | POA: Diagnosis not present

## 2024-05-24 DIAGNOSIS — K449 Diaphragmatic hernia without obstruction or gangrene: Secondary | ICD-10-CM | POA: Insufficient documentation

## 2024-05-24 DIAGNOSIS — K2289 Other specified disease of esophagus: Secondary | ICD-10-CM

## 2024-05-24 DIAGNOSIS — L299 Pruritus, unspecified: Secondary | ICD-10-CM | POA: Diagnosis not present

## 2024-05-24 DIAGNOSIS — K760 Fatty (change of) liver, not elsewhere classified: Secondary | ICD-10-CM | POA: Diagnosis not present

## 2024-05-24 DIAGNOSIS — K319 Disease of stomach and duodenum, unspecified: Secondary | ICD-10-CM

## 2024-05-24 DIAGNOSIS — I899 Noninfective disorder of lymphatic vessels and lymph nodes, unspecified: Secondary | ICD-10-CM

## 2024-05-24 HISTORY — PX: ESOPHAGOGASTRODUODENOSCOPY: SHX5428

## 2024-05-24 HISTORY — PX: EUS: SHX5427

## 2024-05-24 LAB — HEPATIC FUNCTION PANEL
ALT: 30 U/L (ref 0–44)
AST: 20 U/L (ref 15–41)
Albumin: 4.1 g/dL (ref 3.5–5.0)
Alkaline Phosphatase: 55 U/L (ref 38–126)
Bilirubin, Direct: 0.2 mg/dL (ref 0.0–0.2)
Indirect Bilirubin: 0.2 mg/dL — ABNORMAL LOW (ref 0.3–0.9)
Total Bilirubin: 0.5 mg/dL (ref 0.0–1.2)
Total Protein: 6.6 g/dL (ref 6.5–8.1)

## 2024-05-24 LAB — HEPATITIS C ANTIBODY: HCV Ab: NONREACTIVE

## 2024-05-24 LAB — GLUCOSE, CAPILLARY
Glucose-Capillary: 185 mg/dL — ABNORMAL HIGH (ref 70–99)
Glucose-Capillary: 217 mg/dL — ABNORMAL HIGH (ref 70–99)

## 2024-05-24 LAB — HEPATITIS A ANTIBODY, TOTAL: hep A Total Ab: REACTIVE — AB

## 2024-05-24 LAB — HEPATITIS B SURFACE ANTIGEN: Hepatitis B Surface Ag: NONREACTIVE

## 2024-05-24 SURGERY — ULTRASOUND, UPPER GI TRACT, ENDOSCOPIC
Anesthesia: Monitor Anesthesia Care

## 2024-05-24 MED ORDER — CIPROFLOXACIN IN D5W 400 MG/200ML IV SOLN
INTRAVENOUS | Status: DC | PRN
  Administered 2024-05-24: 400 mg via INTRAVENOUS

## 2024-05-24 MED ORDER — SULFAMETHOXAZOLE-TRIMETHOPRIM 800-160 MG PO TABS: 1.0000 | ORAL_TABLET | Freq: Two times a day (BID) | ORAL | 0 refills | Status: AC

## 2024-05-24 MED ORDER — DIPHENHYDRAMINE HCL 25 MG PO CAPS
25.0000 mg | ORAL_CAPSULE | Freq: Once | ORAL | Status: AC
  Administered 2024-05-24: 25 mg via ORAL
  Filled 2024-05-24: qty 1

## 2024-05-24 MED ORDER — LIDOCAINE 2% (20 MG/ML) 5 ML SYRINGE
INTRAMUSCULAR | Status: DC | PRN
  Administered 2024-05-24: 100 mg via INTRAVENOUS

## 2024-05-24 MED ORDER — CIPROFLOXACIN HCL 500 MG PO TABS: 500.0000 mg | ORAL_TABLET | Freq: Two times a day (BID) | ORAL | 0 refills | Status: DC

## 2024-05-24 MED ORDER — PROPOFOL 500 MG/50ML IV EMUL
INTRAVENOUS | Status: DC | PRN
  Administered 2024-05-24: 140 ug/kg/min via INTRAVENOUS

## 2024-05-24 MED ORDER — CIPROFLOXACIN IN D5W 400 MG/200ML IV SOLN
INTRAVENOUS | Status: AC
  Filled 2024-05-24: qty 200

## 2024-05-24 MED ORDER — SODIUM CHLORIDE 0.9 % IV SOLN: INTRAVENOUS | Status: DC

## 2024-05-24 MED ORDER — PROPOFOL 1000 MG/100ML IV EMUL
INTRAVENOUS | Status: AC
  Filled 2024-05-24: qty 100

## 2024-05-24 MED ORDER — DIPHENHYDRAMINE HCL 12.5 MG/5ML PO LIQD
25.0000 mg | Freq: Once | ORAL | Status: DC
  Filled 2024-05-24: qty 10

## 2024-05-24 MED ORDER — PROPOFOL 10 MG/ML IV BOLUS
INTRAVENOUS | Status: DC | PRN
  Administered 2024-05-24: 20 mg via INTRAVENOUS
  Administered 2024-05-24: 30 mg via INTRAVENOUS

## 2024-05-24 NOTE — Transfer of Care (Signed)
 Immediate Anesthesia Transfer of Care Note  Patient: Joshua Lane  Procedure(s) Performed: ULTRASOUND, UPPER GI TRACT, ENDOSCOPIC EGD (ESOPHAGOGASTRODUODENOSCOPY)  Patient Location: PACU  Anesthesia Type:MAC  Level of Consciousness: sedated  Airway & Oxygen Therapy: Patient Spontanous Breathing and Patient connected to face mask oxygen  Post-op Assessment: Report given to RN and Post -op Vital signs reviewed and stable  Post vital signs: Reviewed and stable  Last Vitals:  Vitals Value Taken Time  BP 93/72 05/24/24 12:56  Temp 36.4 C 05/24/24 12:56  Pulse 71 05/24/24 12:57  Resp 27 05/24/24 12:57  SpO2 98 % 05/24/24 12:57  Vitals shown include unfiled device data.  Last Pain:  Vitals:   05/24/24 1256  TempSrc: Temporal  PainSc:          Complications: No notable events documented.

## 2024-05-24 NOTE — Op Note (Signed)
 [facility] Patient Name: Joshua Lane Procedure Date: 05/24/2024 MRN: [id number] Attending MD: Aloha Finner , MD, [id number] Date of Birth: 11-09-1945 CSN: [id number] Age: 78 Admit Type: Outpatient Procedure:                Upper EUS Indications:              Pancreatic cyst on MRCP, Elevated CA 19-9 Providers:                Aloha Finner, MD, Olam Riedel, RN, Haskel Chris, Technician Referring MD:              Medicines:                Monitored Anesthesia Care Complications:            No immediate complications. Estimated Blood Loss:     Estimated blood loss was minimal. Procedure:                Pre-Anesthesia Assessment:                           - Prior to the procedure, a History and Physical                            was performed, and patient medications and                            allergies were reviewed. The patient's tolerance of                            previous anesthesia was also reviewed. The risks                            and benefits of the procedure and the sedation                            options and risks were discussed with the patient.                            All questions were answered, and informed consent                            was obtained. Prior Anticoagulants: The patient has                            taken no anticoagulant or antiplatelet agents                            except for aspirin. ASA Grade Assessment: III - A                            patient with severe systemic disease. After  reviewing the risks and benefits, the patient was                            deemed in satisfactory condition to undergo the                            procedure.                           After obtaining informed consent, the endoscope was                            passed under direct vision. Throughout the                            procedure, the patient's blood  pressure, pulse, and                            oxygen saturations were monitored continuously. The                            [id number]-[id number] ([id number]) Olympus endoscope was introduced                            through the mouth, and advanced to the second part                            of duodenum. The [id number]-[id number] ([id number]) Olympus                            duodenoscope was introduced through the mouth, and                            advanced to the area of papilla. The [id number]                            ([id number]) Olympus endosonoscope was introduced                            through the mouth, and advanced to the duodenum for                            ultrasound examination from the esophagus, stomach                            and duodenum. The upper EUS was accomplished                            without difficulty. The patient tolerated the                            procedure. Scope In: Scope Out: Findings:      ENDOSCOPIC FINDING: :      No gross lesions were noted in the entire esophagus.  The Z-line was irregular and was found 37 cm from the incisors.      A 1 cm hiatal hernia was present.      Multiple 2 to 10 mm semi-sessile polyps with no bleeding and no stigmata       of recent bleeding were found in the cardia, in the gastric fundus and       in the gastric body. Consistent with fundic gland polyps.      Two diminutive angioectasias with no bleeding were found in the gastric       body.      Patchy mildly erythematous mucosa without bleeding was found in the       entire examined stomach. Biopsies were taken with a cold forceps for       histology and Helicobacter pylori testing.      Diffuse moderate mucosal changes characterized by congestion, scalloping       and altered texture were found in the duodenal bulb, in the first       portion of the duodenum and in the second portion of the duodenum.       Biopsies were taken with a cold forceps for histology.      The  major papilla was normal.      ENDOSONOGRAPHIC FINDING: :      An anechoic lesion suggestive of a cyst was identified in the pancreatic       head. It is not in obvious communication with the pancreatic duct. The       lesion measured 19 mm by 22 mm in maximal cross-sectional diameter.       There were a few compartments thinly septated. The outer wall of the       lesion was thin. There was no associated mass. There was no internal       debris within the fluid-filled cavity. Diagnostic needle aspiration for       fluid was performed. Color Doppler imaging was utilized prior to needle       puncture to confirm a lack of significant vascular structures within the       needle path. One pass was made with the 22 gauge needle using a       transduodenal approach. A stylet was used. The amount of fluid collected       was 1 mL. The fluid was clear, white and viscous. Sample were sent for       chemistry, amylase concentration and CEA. A small amount of fluid sent       for cytology as well.      Anechoic lesions suggestive of two cysts were identified in the       pancreatic tail. The largest lesion measured 4 mm by 4 mm in maximal       cross-sectional diameter. There was no associated mass.      Pancreatic parenchymal abnormalities were noted in the entire pancreas.       These consisted of lobularity without honeycombing and hyperechoic       strands.      The diameter of the main pancreatic duct (MPD) measured:      - HOP 2.8 mm (head of pancreas)      - NOP 1.7 mm (neck of pancreas)      - BOP 0.9 mm (body of the pancreas)      - TOP 0.6 mm (tail of the pancreas).      There was no sign  of significant endosonographic abnormality in the       common bile duct (2.1 -> 4.5 mm) and in the common hepatic duct (5.5 mm).      Multiple stones were visualized endosonographically in the gallbladder.       The stones were round. They were hyperechoic and characterized by       shadowing.       Endosonographic imaging of the ampulla showed no intramural       (subepithelial) lesion.      Endosonographic imaging in the visualized portion of the liver showed no       mass.      No malignant-appearing lymph nodes were visualized in the celiac region       (level 20), peripancreatic region and porta hepatis region.      Endosonographic imaging in the thoracic esophagus and in the       gastroesophageal junction showed no intramural (subepithelial) lesion.      The celiac region was visualized.      The esophagus, stomach and duodenum were examined endosonographically. Impression:               EGD Impression:                           - No gross lesions in the entire esophagus. Z-line                            irregular, 37 cm from the incisors.                           - 1 cm hiatal hernia.                           - Multiple gastric polyps (fundic gland in                            appearance).                           - Two non-bleeding angioectasias in the stomach.                           - Erythematous mucosa in the stomach. Biopsied.                           - Mucosal changes in the duodenum. Biopsied.                           - Normal major papilla.                           EUS Impression:                           - A cystic lesion was seen in the pancreatic head.                            Cytology results are pending. However, the  endosonographic appearance is suggestive of an                            intraductal papillary mucinous neoplasm though SCA                            could be possible as well. Fine needle aspiration                            for fluid performed.                           - Two cystic lesions were seen in the pancreatic                            tail. Tissue has not been obtained. However, the                            endosonographic appearance is suggestive of an                            intraductal  papillary mucinous neoplasm.                           - Pancreatic parenchymal abnormalities consisting                            of lobularity and hyperechoic strands were noted in                            the entire pancreas.                           - Main pancreatic duct (MPD) diameter was measured.                            Endosonographically, the MPD had a normal                            appearance.                           - There was no sign of significant pathology in the                            common bile duct and in the common hepatic duct.                           - Multiple stones were visualized                            endosonographically in the gallbladder.                           - No malignant-appearing lymph nodes were  visualized in the celiac region (level 20),                            peripancreatic region and porta hepatis region. Moderate Sedation:      Not Applicable - Patient had care per Anesthesia. Recommendation:           - The patient will be observed post-procedure,                            until all discharge criteria are met.                           - Discharge patient to home.                           - Patient has a contact number available for                            emergencies. The signs and symptoms of potential                            delayed complications were discussed with the                            patient. Return to normal activities tomorrow.                            Written discharge instructions were provided to the                            patient.                           - Low fat diet.                           - Observe patient's clinical course.                           - Await cytology results and await path results.                           - Based on fluid analysis will determine what                            follow up and/or imaging may be required.                            - Ciprofloxacin  500 mg twice daily for 3-days to                            decrease risk of post-interventional infection.                           - Monitor for signs/symptoms of bleeding,  perforation, and infection. If issues please call                            our number to get further assistance as needed.                           - The findings and recommendations were discussed                            with the patient.                           - The findings and recommendations were discussed                            with the designated responsible adult. Procedure Code(s):        --- Professional ---                           [phone], Esophagogastroduodenoscopy, flexible,                            transoral; with transendoscopic ultrasound-guided                            intramural or transmural fine needle                            aspiration/biopsy(s) (includes endoscopic                            ultrasound examination of the esophagus, stomach,                            and either the duodenum or a surgically altered                            stomach where the jejunum is examined distal to the                            anastomosis)                           43239, 59, Esophagogastroduodenoscopy, flexible,                            transoral; with biopsy, single or multiple Diagnosis Code(s):        --- Professional ---                           K22.89, Other specified disease of esophagus                           K44.9, Diaphragmatic hernia without obstruction or  gangrene                           K31.7, Polyp of stomach and duodenum                           K31.819, Angiodysplasia of stomach and duodenum                            without bleeding                           K31.89, Other diseases of stomach and duodenum                           K86.2, Cyst of pancreas                            K86.9, Disease of pancreas, unspecified                           K80.20, Calculus of gallbladder without                            cholecystitis without obstruction                           I89.9, Noninfective disorder of lymphatic vessels                            and lymph nodes, unspecified                           R97.8, Other abnormal tumor markers CPT copyright 2022 American [employer] Association. All rights reserved. The codes documented in this report are preliminary and upon coder review may  be revised to meet current compliance requirements. Aloha Finner, MD 05/24/2024 1:18:07 PM Number of Addenda: 0

## 2024-05-24 NOTE — Progress Notes (Signed)
 Due to possible reaction from Ciprofloxacin  (itching at IV site), will plan to use Bactrim  DS BID x 3-days to decreas risk of post-infectious complications after EUS sampling.  Aloha Finner, MD [facility] Office # [phone]

## 2024-05-24 NOTE — Anesthesia Preprocedure Evaluation (Addendum)
 Anesthesia Evaluation  Patient identified by MRN, date of birth, ID band Patient awake    Reviewed: Allergy & Precautions, H&P , NPO status , Patient's Chart, lab work & pertinent test results  History of Anesthesia Complications Negative for: history of anesthetic complications  Airway Mallampati: II  TM Distance: >3 FB Neck ROM: Full    Dental  (+) Edentulous Upper, Edentulous Lower   Pulmonary former smoker   Pulmonary exam normal breath sounds clear to auscultation       Cardiovascular hypertension, (-) angina (-) Past MI Normal cardiovascular exam Rhythm:Regular Rate:Normal     Neuro/Psych neg Seizures  negative psych ROS   GI/Hepatic NAFLD Pancreatic cyst   Endo/Other  diabetes, Type 2    Renal/GU negative Renal ROS  negative genitourinary   Musculoskeletal negative musculoskeletal ROS (+)    Abdominal   Peds negative pediatric ROS (+)  Hematology negative hematology ROS (+)   Anesthesia Other Findings   Reproductive/Obstetrics negative OB ROS                              Anesthesia Physical Anesthesia Plan  ASA: 3  Anesthesia Plan: MAC   Post-op Pain Management:    Induction: Intravenous  PONV Risk Score and Plan: 1 and Propofol  infusion and Treatment may vary due to age or medical condition  Airway Management Planned: Natural Airway  Additional Equipment:   Intra-op Plan:   Post-operative Plan:   Informed Consent: I have reviewed the patients History and Physical, chart, labs and discussed the procedure including the risks, benefits and alternatives for the proposed anesthesia with the patient or authorized representative who has indicated his/her understanding and acceptance.     Dental advisory given  Plan Discussed with: CRNA  Anesthesia Plan Comments:          Anesthesia Quick Evaluation

## 2024-05-24 NOTE — Anesthesia Postprocedure Evaluation (Signed)
 Anesthesia Post Note  Patient: Joshua Lane  Procedure(s) Performed: ULTRASOUND, UPPER GI TRACT, ENDOSCOPIC EGD (ESOPHAGOGASTRODUODENOSCOPY)     Patient location during evaluation: PACU Anesthesia Type: MAC Level of consciousness: awake and alert Pain management: pain level controlled Vital Signs Assessment: post-procedure vital signs reviewed and stable Respiratory status: spontaneous breathing, nonlabored ventilation, respiratory function stable and patient connected to nasal cannula oxygen Cardiovascular status: stable and blood pressure returned to baseline Postop Assessment: no apparent nausea or vomiting Anesthetic complications: no   No notable events documented.  Last Vitals:  Vitals:   05/24/24 1330 05/24/24 1350  BP: 120/76 (!) 127/95  Pulse: 89 87  Resp: (!) 24 (!) 23  Temp:    SpO2: 99% 98%    Last Pain:  Vitals:   05/24/24 1320  TempSrc:   PainSc: 0-No pain                 Thom JONELLE Peoples

## 2024-05-24 NOTE — Discharge Instructions (Signed)

## 2024-05-24 NOTE — Interval H&P Note (Signed)
 History and Physical Interval Note:  05/24/2024 10:58 AM  Joshua Lane  has presented today for surgery, with the diagnosis of Pancreatic Cyst.  The various methods of treatment have been discussed with the patient and family. After consideration of risks, benefits and other options for treatment, the patient has consented to  Procedure(s): ULTRASOUND, UPPER GI TRACT, ENDOSCOPIC (N/A) as a surgical intervention.  The patient's history has been reviewed, patient examined, no change in status, stable for surgery.  I have reviewed the patient's chart and labs.  Questions were answered to the patient's satisfaction.    The risks of an EUS including intestinal perforation, bleeding, infection, aspiration, and medication effects were discussed as was the possibility it may not give a definitive diagnosis if a biopsy is performed.  When a biopsy of the pancreas is done as part of the EUS, there is an additional risk of pancreatitis at the rate of about 1-2%.  It was explained that procedure related pancreatitis is typically mild, although it can be severe and even life threatening, which is why we do not perform random pancreatic biopsies and only biopsy a lesion/area we feel is concerning enough to warrant the risk.    Affie Gasner Mansouraty Jr

## 2024-05-25 ENCOUNTER — Ambulatory Visit: Payer: Self-pay | Admitting: Gastroenterology

## 2024-05-25 LAB — HEPATITIS B SURFACE ANTIBODY, QUANTITATIVE: Hep B S AB Quant (Post): <3.5 m[IU]/mL — ABNORMAL LOW

## 2024-05-25 LAB — HEPATITIS B CORE ANTIBODY, TOTAL: HEP B CORE AB: NEGATIVE

## 2024-05-25 LAB — SURGICAL PATHOLOGY

## 2024-05-26 ENCOUNTER — Encounter (HOSPITAL_COMMUNITY): Payer: Self-pay | Admitting: Gastroenterology

## 2024-05-26 LAB — CYTOLOGY - NON PAP

## 2024-06-10 ENCOUNTER — Encounter: Payer: Self-pay | Admitting: Gastroenterology

## 2024-06-10 NOTE — Progress Notes (Unsigned)
 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  This is what we thought it could be.  This is a premalignant cyst, but thankfully no evidence of any cancer cells have been found.  This will need monitoring at a minimum.  Based on the patient's elevated CA 19-9, even though we did not see any evidence of a mass or lesion, I would like to discuss his case at multidisciplinary conference.  I will try to do that in the next few weeks (pending availability for discussion).  My current recommendation is for patient to undergo early repeat imaging with MRI/MRCP in 3 to 4 months and a CA 19-9 to be drawn at the same time.    He may need a referral to one of our pancreaticobiliary surgeons, but I will update him if that is the consensus from the [facility].  I will forward this to my team to let him know and get his fluid analysis scanned into the chart and also sent to him for his records.   Aloha Finner, MD [facility] Office # [phone]

## 2024-06-11 NOTE — Progress Notes (Signed)
 The pt and his wife have been advised of the recommendations.  He agrees to the MRI MRCP in 3-4 months. He will be called at that time.  He is aware that he will be contacted after the [facility]DC.

## 2024-06-11 NOTE — Progress Notes (Signed)
 Thank you for letting them know. GM

## 2024-06-16 ENCOUNTER — Inpatient Hospital Stay: Attending: Oncology

## 2024-06-16 DIAGNOSIS — K862 Cyst of pancreas: Secondary | ICD-10-CM

## 2024-06-16 NOTE — Progress Notes (Signed)
 Multidisciplinary Oncology Council Documentation  Joshua Lane was presented by our [facility] on 06/16/2024, which included representatives from:  Palliative Care Dietitian  Physical/Occupational Therapist Nurse Navigator Genetics Social work Survivorship RN Financial Navigator Research RN   Alassane currently presents with history of pancreatic mass  We reviewed previous medical and familial history, history of present illness, and recent lab results along with all available histopathologic and imaging studies. The MOC considered available treatment options and made the following recommendations/referrals:  None currently  The MOC is a meeting of clinicians from various specialty areas who evaluate and discuss patients for whom a multidisciplinary approach is being considered. Final determinations in the plan of care are those of the provider(s).   Today's extended care, comprehensive team conference, Joshua Lane was not present for the discussion and was not examined.

## 2024-06-21 ENCOUNTER — Other Ambulatory Visit: Payer: Self-pay | Admitting: Nurse Practitioner

## 2024-06-27 ENCOUNTER — Other Ambulatory Visit: Payer: Self-pay | Admitting: Cardiovascular Disease

## 2024-06-28 ENCOUNTER — Other Ambulatory Visit: Payer: Self-pay

## 2024-06-28 MED ORDER — ACCU-CHEK GUIDE TEST VI STRP: ORAL_STRIP | 12 refills | Status: AC

## 2024-06-28 MED ORDER — ACCU-CHEK SOFTCLIX LANCETS MISC: 1 refills | Status: AC

## 2024-06-28 MED ORDER — BD SWAB SINGLE USE REGULAR PADS: MEDICATED_PAD | 3 refills | Status: AC

## 2024-06-29 NOTE — Telephone Encounter (Signed)
 Pt of Dr. Gollan. Does Dr. Gollan want to refill this RX? Please advise.

## 2024-07-05 ENCOUNTER — Other Ambulatory Visit: Payer: Self-pay

## 2024-07-05 MED ORDER — BD SWAB SINGLE USE REGULAR PADS: MEDICATED_PAD | 3 refills | Status: AC

## 2024-07-07 ENCOUNTER — Encounter: Payer: Self-pay | Admitting: Gastroenterology

## 2024-07-07 ENCOUNTER — Other Ambulatory Visit: Payer: Self-pay | Admitting: *Deleted

## 2024-07-07 NOTE — Progress Notes (Signed)
 The proposed treatment discussed in conference is for discussion purpose only and is not a binding recommendation.  The patients have not been physically examined, or presented with their treatment options.  Therefore, final treatment plans cannot be decided.

## 2024-07-07 NOTE — Progress Notes (Signed)
 Case discussed at [facility]. Reviewed previous imaging and updated imaging. Reviewed EUS findings. Based on findings we do believe that this is an IPMN and with the elevated CA19-9 we would want to continue to monitor this area closely. If things progress in size or the CA19-9 continues to enlarge, referral to  If the patient would like to meet one of our Pancreaticobiliary surgeons, we can proceed with referral, but for now close monitoring is recommended.  Plan: MRI/MRCP in 57-months with repeat CA19-9 (will be done under my name). Followup to be dictated based on results If patient would like referral to Pancreaticobiliary surgery, then we can refer to Dr. Allen/Dr. Aron, otherwise close monitoring Will forward results to the referring provider   Aloha Finner, MD [facility] Office # [phone]

## 2024-07-07 NOTE — Progress Notes (Signed)
 Spoke with the pt and made him aware of the discussion.  He prefers to have MRCP and lab in 4 months  He will be called in 4  months   He prefers to NOT have [facility] referral.

## 2024-07-14 ENCOUNTER — Other Ambulatory Visit: Payer: Self-pay

## 2024-07-14 MED ORDER — GLIPIZIDE ER 5 MG PO TB24: 5.0000 mg | ORAL_TABLET | Freq: Every day | ORAL | Status: DC

## 2024-07-17 ENCOUNTER — Other Ambulatory Visit
Admission: RE | Admit: 2024-07-17 | Discharge: 2024-07-17 | Disposition: A | Attending: Nurse Practitioner | Admitting: Nurse Practitioner

## 2024-07-17 DIAGNOSIS — I119 Hypertensive heart disease without heart failure: Secondary | ICD-10-CM | POA: Diagnosis not present

## 2024-07-17 DIAGNOSIS — E1100 Type 2 diabetes mellitus with hyperosmolarity without nonketotic hyperglycemic-hyperosmolar coma (NKHHC): Secondary | ICD-10-CM | POA: Diagnosis present

## 2024-07-17 LAB — COMPREHENSIVE METABOLIC PANEL WITH GFR
ALT: 24 U/L (ref 0–44)
AST: 18 U/L (ref 15–41)
Albumin: 4.1 g/dL (ref 3.5–5.0)
Alkaline Phosphatase: 65 U/L (ref 38–126)
Anion gap: 11 (ref 5–15)
BUN: 26 mg/dL — ABNORMAL HIGH (ref 8–23)
CO2: 25 mmol/L (ref 22–32)
Calcium: 9.7 mg/dL (ref 8.9–10.3)
Chloride: 101 mmol/L (ref 98–111)
Creatinine, Ser: 1.18 mg/dL (ref 0.61–1.24)
GFR, Estimated: >60 mL/min
Glucose, Bld: 163 mg/dL — ABNORMAL HIGH (ref 70–99)
Potassium: 4 mmol/L (ref 3.5–5.1)
Sodium: 136 mmol/L (ref 135–145)
Total Bilirubin: 0.6 mg/dL (ref 0.0–1.2)
Total Protein: 6.8 g/dL (ref 6.5–8.1)

## 2024-07-17 LAB — CBC WITH DIFFERENTIAL/PLATELET
Abs Immature Granulocytes: 0.02 K/uL (ref 0.00–0.07)
Basophils Absolute: 0.1 K/uL (ref 0.0–0.1)
Basophils Relative: 1 %
Eosinophils Absolute: 0.5 K/uL (ref 0.0–0.5)
Eosinophils Relative: 6 %
HCT: 48.1 % (ref 39.0–52.0)
Hemoglobin: 16.3 g/dL (ref 13.0–17.0)
Immature Granulocytes: 0 %
Lymphocytes Relative: 36 %
Lymphs Abs: 3 K/uL (ref 0.7–4.0)
MCH: 28.4 pg (ref 26.0–34.0)
MCHC: 33.9 g/dL (ref 30.0–36.0)
MCV: 83.9 fL (ref 80.0–100.0)
Monocytes Absolute: 0.8 K/uL (ref 0.1–1.0)
Monocytes Relative: 10 %
Neutro Abs: 3.9 K/uL (ref 1.7–7.7)
Neutrophils Relative %: 47 %
Platelets: 244 K/uL (ref 150–400)
RBC: 5.73 MIL/uL (ref 4.22–5.81)
RDW: 13 % (ref 11.5–15.5)
WBC: 8.2 K/uL (ref 4.0–10.5)
nRBC: 0 % (ref 0.0–0.2)

## 2024-07-17 LAB — HEMOGLOBIN A1C
Hgb A1c MFr Bld: 7.9 % — ABNORMAL HIGH (ref 4.8–5.6)
Mean Plasma Glucose: 180.03 mg/dL

## 2024-07-17 LAB — LIPID PANEL
Cholesterol: 116 mg/dL (ref 0–200)
HDL: 45 mg/dL
LDL Cholesterol: 51 mg/dL (ref 0–99)
Total CHOL/HDL Ratio: 2.6 ratio
Triglycerides: 102 mg/dL
VLDL: 20 mg/dL (ref 0–40)

## 2024-07-19 ENCOUNTER — Ambulatory Visit: Payer: Self-pay | Admitting: Nurse Practitioner

## 2024-08-04 ENCOUNTER — Ambulatory Visit (INDEPENDENT_AMBULATORY_CARE_PROVIDER_SITE_OTHER): Admitting: Nurse Practitioner

## 2024-08-04 ENCOUNTER — Encounter: Payer: Self-pay | Admitting: Nurse Practitioner

## 2024-08-04 VITALS — BP 110/80 | HR 100 | Temp 96.9°F | Resp 16 | Ht 67.25 in | Wt 202.8 lb

## 2024-08-04 DIAGNOSIS — E1159 Type 2 diabetes mellitus with other circulatory complications: Secondary | ICD-10-CM | POA: Diagnosis not present

## 2024-08-04 DIAGNOSIS — G4709 Other insomnia: Secondary | ICD-10-CM

## 2024-08-04 DIAGNOSIS — I152 Hypertension secondary to endocrine disorders: Secondary | ICD-10-CM

## 2024-08-04 DIAGNOSIS — K760 Fatty (change of) liver, not elsewhere classified: Secondary | ICD-10-CM | POA: Diagnosis not present

## 2024-08-04 DIAGNOSIS — N401 Enlarged prostate with lower urinary tract symptoms: Secondary | ICD-10-CM | POA: Insufficient documentation

## 2024-08-04 DIAGNOSIS — E1169 Type 2 diabetes mellitus with other specified complication: Secondary | ICD-10-CM

## 2024-08-04 DIAGNOSIS — D490 Neoplasm of unspecified behavior of digestive system: Secondary | ICD-10-CM | POA: Diagnosis not present

## 2024-08-04 DIAGNOSIS — E785 Hyperlipidemia, unspecified: Secondary | ICD-10-CM | POA: Diagnosis not present

## 2024-08-04 DIAGNOSIS — R3911 Hesitancy of micturition: Secondary | ICD-10-CM | POA: Diagnosis not present

## 2024-08-04 MED ORDER — GLIPIZIDE ER 10 MG PO TB24: 10.0000 mg | ORAL_TABLET | Freq: Every day | ORAL | 3 refills | Status: AC

## 2024-08-04 MED ORDER — LOSARTAN POTASSIUM-HCTZ 100-25 MG PO TABS: 1.0000 | ORAL_TABLET | Freq: Every day | ORAL | 3 refills | Status: AC

## 2024-08-04 MED ORDER — PRAVASTATIN SODIUM 20 MG PO TABS: 20.0000 mg | ORAL_TABLET | Freq: Every day | ORAL | 3 refills | Status: AC

## 2024-08-04 MED ORDER — AMLODIPINE BESYLATE 10 MG PO TABS: 10.0000 mg | ORAL_TABLET | Freq: Every day | ORAL | 3 refills | Status: AC

## 2024-08-04 MED ORDER — TAMSULOSIN HCL 0.4 MG PO CAPS: 0.4000 mg | ORAL_CAPSULE | Freq: Every evening | ORAL | 3 refills | Status: AC

## 2024-08-04 MED ORDER — METFORMIN HCL 500 MG PO TABS: 1000.0000 mg | ORAL_TABLET | Freq: Two times a day (BID) | ORAL | 3 refills | Status: AC

## 2024-08-04 MED ORDER — EMPAGLIFLOZIN 25 MG PO TABS: 25.0000 mg | ORAL_TABLET | Freq: Every day | ORAL | 3 refills | Status: AC

## 2024-08-04 NOTE — Progress Notes (Signed)
 [facility] [address] [place], [place] [address]  Internal MEDICINE  Office Visit Note  Patient Name: Joshua Lane  [id number]  [id number]  Date of Service: 08/04/2024  Chief Complaint  Patient presents with   Diabetes   Hypertension   Follow-up    HPI Rocco presents for a follow-up visit for diabetes, hypertension, high cholesterol, and trouble sleeping.  Diabetes -- A1c is slightly elevated at 7.9. He has been taking jardiance  25 mg daily, glipizide  XL 5 mg daily, and metformin  1000 mg twice daily. Goal A1c for his age is under 7.5.  Hypertension -- well controled with amlodipine  and losartan -hydrochlorothiazide.  High cholesterol -- taking pravastatin  daily  Trouble sleeping -- having difficulty falling asleep. Reports that he goes to bed at about 9 pm and will lay in bed awake for 1-2 hours before finally falling asleep.  Somehow all of his medications were changed to be under Dr. Tresia name for prescriber even non cardiac/BP medications. The patient prefers to have all of his medications managed by 1 provider, preferrably his PCP and wants the prescriptions to be fixed and resent to [facility].     Current Medication: Outpatient Encounter Medications as of 08/04/2024  Medication Sig   glipiZIDE  (GLUCOTROL  XL) 10 MG 24 hr tablet Take 1 tablet (10 mg total) by mouth daily with breakfast.   Accu-Chek Softclix Lancets lancets Use as instructed once daily Dx E11.65   Alcohol Swabs (B-D SINGLE USE SWABS REGULAR) PADS Use as directed twice a day DX E11.65   amLODipine  (NORVASC ) 10 MG tablet Take 1 tablet (10 mg total) by mouth daily.   aspirin EC 81 MG tablet Take 81 mg by mouth daily.   empagliflozin  (JARDIANCE ) 25 MG TABS tablet Take 1 tablet (25 mg total) by mouth daily.   glucose blood (ACCU-CHEK GUIDE TEST) test strip Use 1 test strip to check glucose once daily and as needed for diabetes E11.65   hydrocortisone  2.5 % lotion Apply topically as directed. Apply to  face three times per week- Monday, Wednesday, Friday   ketoconazole  (NIZORAL ) 2 % shampoo Apply 1 Application topically 3 (three) times a week. Wash face, scalp, ears daily.   losartan -hydrochlorothiazide (HYZAAR) 100-25 MG tablet Take 1 tablet by mouth daily.   meloxicam (MOBIC) 7.5 MG tablet TAKE 1 TABLET BY MOUTH EVERY DAY   metFORMIN  (GLUCOPHAGE ) 500 MG tablet Take 2 tablets (1,000 mg total) by mouth 2 (two) times daily.   methocarbamol  (ROBAXIN ) 500 MG tablet Take 1-2 tablets (500-1,000 mg total) by mouth every 6 (six) hours as needed for muscle spasms.   omeprazole (PRILOSEC) 20 MG capsule Take 20 mg by mouth daily.   pravastatin  (PRAVACHOL ) 20 MG tablet Take 1 tablet (20 mg total) by mouth daily.   tamsulosin  (FLOMAX ) 0.4 MG CAPS capsule Take 1 capsule (0.4 mg total) by mouth every evening.   [DISCONTINUED] amLODipine  (NORVASC ) 10 MG tablet Take 1 tablet (10 mg total) by mouth daily.   [DISCONTINUED] empagliflozin  (JARDIANCE ) 25 MG TABS tablet Take 1 tablet (25 mg total) by mouth daily.   [DISCONTINUED] glipiZIDE  (GLUCOTROL  XL) 5 MG 24 hr tablet Take 1 tablet (5 mg total) by mouth daily.   [DISCONTINUED] losartan -hydrochlorothiazide (HYZAAR) 100-25 MG tablet Take 1 tablet by mouth daily.   [DISCONTINUED] metFORMIN  (GLUCOPHAGE ) 500 MG tablet Take 2 tablets (1,000 mg total) by mouth 2 (two) times daily.   [DISCONTINUED] pravastatin  (PRAVACHOL ) 20 MG tablet Take 1 tablet (20 mg total) by mouth daily.   [  DISCONTINUED] tamsulosin  (FLOMAX ) 0.4 MG CAPS capsule TAKE 1 TABLET BY MOUTH EVERY EVENING   No facility-administered encounter medications on file as of 08/04/2024.    Surgical History: Past Surgical History:  Procedure Laterality Date   APPENDECTOMY     BROW LIFT Bilateral 08/03/2020   Procedure: BLEPHAROPLASTY UPPER EYELID WIT EXCESS SKIN DIABETIC;  Surgeon: Ashley Greig HERO, MD;  Location: [facility];  Service: Ophthalmology;  Laterality: Bilateral;  Diabetic    ESOPHAGOGASTRODUODENOSCOPY N/A 05/24/2024   Procedure: EGD (ESOPHAGOGASTRODUODENOSCOPY);  Surgeon: Wilhelmenia Aloha Raddle., MD;  Location: [facility];  Service: Gastroenterology;  Laterality: N/A;   EUS N/A 05/24/2024   Procedure: ULTRASOUND, UPPER GI TRACT, ENDOSCOPIC;  Surgeon: Wilhelmenia Aloha Raddle., MD;  Location: [facility];  Service: Gastroenterology;  Laterality: N/A;   NECK SURGERY      Medical History: Past Medical History:  Diagnosis Date   Actinic keratosis    Aortic atherosclerosis    Basal cell carcinoma 09/20/2020   L temple MOHs 01/05/2021   Cholelithiasis    Diabetes mellitus without complication (HCC)    Diverticulosis    Hepatic steatosis    Hypertension    Pancreatic cyst    Smoker 09/10/2016   Type 2 diabetes mellitus with hyperosmolarity without coma, without Mcweeney-term current use of insulin (HCC) 05/15/2015    Family History: Family History  Problem Relation Age of Onset   Diabetes Mother    Colon cancer Neg Hx    Esophageal cancer Neg Hx    Inflammatory bowel disease Neg Hx    Liver disease Neg Hx    Pancreatic cancer Neg Hx    Rectal cancer Neg Hx    Stomach cancer Neg Hx     Social History   Socioeconomic History   Marital status: Married    Spouse name: Not on file   Number of children: Not on file   Years of education: Not on file   Highest education level: Not on file  Occupational History   Not on file  Tobacco Use   Smoking status: Former    Current packs/day: 0.00    Average packs/day: 1 pack/day for 55.0 years (55.0 ttl pk-yrs)    Types: Cigarettes    Start date: 06/20/1949    Quit date: 06/19/2006    Years since quitting: 18.1   Smokeless tobacco: Current    Types: Snuff  Vaping Use   Vaping status: Never Used  Substance and Sexual Activity   Alcohol use: Not Currently    Comment: Drinks 1 beer or mixed drink every other night.    Drug use: No   Sexual activity: Not Currently    Birth control/protection: None  Other  Topics Concern   Not on file  Social History Narrative   Not on file   Social Drivers of Health   Tobacco Use: High Risk (08/04/2024)   Patient History    Smoking Tobacco Use: Former    Smokeless Tobacco Use: Current    Passive Exposure: Not on Actuary Strain: Not on file  Food Insecurity: No Food Insecurity (04/02/2024)   Epic    Worried About Programme Researcher, Broadcasting/film/video in the Last Year: Never true    Ran Out of Food in the Last Year: Never true  Transportation Needs: No Transportation Needs (04/02/2024)   Epic    Lack of Transportation (Medical): No    Lack of Transportation (Non-Medical): No  Physical Activity: Not on file  Stress: Not on file  Social  Connections: Not on file  Intimate Partner Violence: Not At Risk (04/02/2024)   Epic    Fear of Current or Ex-Partner: No    Emotionally Abused: No    Physically Abused: No    Sexually Abused: No  Depression (PHQ2-9): Low Risk (04/02/2024)   Depression (PHQ2-9)    PHQ-2 Score: 0  Alcohol Screen: Low Risk (12/03/2023)   Alcohol Screen    Last Alcohol Screening Score (AUDIT): 2  Housing: Low Risk (04/02/2024)   Epic    Unable to Pay for Housing in the Last Year: No    Number of Times Moved in the Last Year: 0    Homeless in the Last Year: No  Utilities: Not At Risk (04/02/2024)   Epic    Threatened with loss of utilities: No  Health Literacy: Not on file      Review of Systems  Constitutional:  Positive for appetite change (snacking more in the evening) and unexpected weight change. Negative for activity change and fatigue.  HENT:  Positive for hearing loss.   Respiratory: Negative.  Negative for cough, chest tightness, shortness of breath and wheezing.   Cardiovascular: Negative.  Negative for chest pain and palpitations.  Gastrointestinal:  Negative for abdominal pain, diarrhea, nausea and vomiting.  Genitourinary:  Negative for difficulty urinating, dysuria and hematuria.       Hesitancy--improved with  tamsulosin   Musculoskeletal:  Positive for arthralgias and back pain (improved).  Neurological: Negative.     Vital Signs: BP 110/80   Pulse 100   Temp (!) 96.9 F (36.1 C)   Resp 16   Ht 5' 7.25 (1.708 m)   Wt 202 lb 12.8 oz (92 kg)   SpO2 97%   BMI 31.53 kg/m    Physical Exam Vitals reviewed.  Constitutional:      General: He is not in acute distress.    Appearance: Normal appearance. He is obese. He is not ill-appearing.  HENT:     Head: Normocephalic and atraumatic.  Eyes:     Pupils: Pupils are equal, round, and reactive to light.  Cardiovascular:     Rate and Rhythm: Normal rate and regular rhythm.  Pulmonary:     Effort: Pulmonary effort is normal. No respiratory distress.  Skin:    Capillary Refill: Capillary refill takes less than 2 seconds.  Neurological:     Mental Status: He is alert and oriented to person, place, and time.  Psychiatric:        Mood and Affect: Mood normal.        Behavior: Behavior normal.        Assessment/Plan: 1. Type 2 diabetes mellitus with other specified complication, without Kammerer-term current use of insulin (HCC) (Primary) A1c is elevated at 7.9. fasting glucose has ranged 140s-160s most recently. Increase glipizide  to 10 mg daily. Continue jardiance  25 mg daily and metformin  1000 mg twice daily as prescribed.  - glipiZIDE  (GLUCOTROL  XL) 10 MG 24 hr tablet; Take 1 tablet (10 mg total) by mouth daily with breakfast.  Dispense: 90 tablet; Refill: 3 - empagliflozin  (JARDIANCE ) 25 MG TABS tablet; Take 1 tablet (25 mg total) by mouth daily.  Dispense: 90 tablet; Refill: 3 - metFORMIN  (GLUCOPHAGE ) 500 MG tablet; Take 2 tablets (1,000 mg total) by mouth 2 (two) times daily.  Dispense: 120 tablet; Refill: 3 - Hgb A1C w/o eAG  2. Hypertension associated with diabetes (HCC) Stable, continue losartan -hydrochlorothiazide and amlodipine  as prescribed.  - losartan -hydrochlorothiazide (HYZAAR) 100-25 MG tablet; Take 1 tablet  by mouth  daily.  Dispense: 90 tablet; Refill: 3 - amLODipine  (NORVASC ) 10 MG tablet; Take 1 tablet (10 mg total) by mouth daily.  Dispense: 90 tablet; Refill: 3  3. Hyperlipidemia associated with type 2 diabetes mellitus (HCC) Continue pravastatin  as prescribed - pravastatin  (PRAVACHOL ) 20 MG tablet; Take 1 tablet (20 mg total) by mouth daily.  Dispense: 90 tablet; Refill: 3  4. NAFLD (nonalcoholic fatty liver disease) Continue to follow up with GI  5. Benign prostatic hyperplasia with urinary hesitancy Continue tamsulosin  as prescribed.  - tamsulosin  (FLOMAX ) 0.4 MG CAPS capsule; Take 1 capsule (0.4 mg total) by mouth every evening.  Dispense: 90 capsule; Refill: 3  6. IPMN (intraductal papillary mucinous neoplasm) Continue to follow up with GI  7. Other insomnia Try OTC melatonin 5-10 mg daily 30 minutes to 1 hour before bedtime.    General Counseling: Jaydian verbalizes understanding of the findings of todays visit and agrees with plan of treatment. I have discussed any further diagnostic evaluation that may be needed or ordered today. We also reviewed his medications today. he has been encouraged to call the office with any questions or concerns that should arise related to todays visit.    No orders of the defined types were placed in this encounter.   Meds ordered this encounter  Medications   glipiZIDE  (GLUCOTROL  XL) 10 MG 24 hr tablet    Sig: Take 1 tablet (10 mg total) by mouth daily with breakfast.    Dispense:  90 tablet    Refill:  3    NOTE INCREASED DOSE, For future refills, all medications are being prescribed by Jamea Robicheaux FNP-C. Please discontinue all previous orders from Dr. Timothy Gollan MD or other providers.   losartan -hydrochlorothiazide (HYZAAR) 100-25 MG tablet    Sig: Take 1 tablet by mouth daily.    Dispense:  90 tablet    Refill:  3    For future refills, all medications are being prescribed by Jovon Winterhalter FNP-C. Please discontinue all previous  orders from Dr. Timothy Gollan MD or other providers.   empagliflozin  (JARDIANCE ) 25 MG TABS tablet    Sig: Take 1 tablet (25 mg total) by mouth daily.    Dispense:  90 tablet    Refill:  3    For future refills, all medications are being prescribed by Elika Godar FNP-C. Please discontinue all previous orders from Dr. Timothy Gollan MD or other providers.   amLODipine  (NORVASC ) 10 MG tablet    Sig: Take 1 tablet (10 mg total) by mouth daily.    Dispense:  90 tablet    Refill:  3    For future refills, all medications are being prescribed by Bernard Slayden FNP-C. Please discontinue all previous orders from Dr. Timothy Gollan MD or other providers.   metFORMIN  (GLUCOPHAGE ) 500 MG tablet    Sig: Take 2 tablets (1,000 mg total) by mouth 2 (two) times daily.    Dispense:  120 tablet    Refill:  3    For future refills, all medications are being prescribed by Random Dobrowski FNP-C. Please discontinue all previous orders from Dr. Timothy Gollan MD or other providers.   pravastatin  (PRAVACHOL ) 20 MG tablet    Sig: Take 1 tablet (20 mg total) by mouth daily.    Dispense:  90 tablet    Refill:  3    For future refills, all medications are being prescribed by Terral Cooks FNP-C. Please discontinue all previous orders from Dr. Timothy Gollan MD  or other providers.   tamsulosin  (FLOMAX ) 0.4 MG CAPS capsule    Sig: Take 1 capsule (0.4 mg total) by mouth every evening.    Dispense:  90 capsule    Refill:  3    For future refills, all medications are being prescribed by Deloris Moger FNP-C. Please discontinue all previous orders from Dr. Timothy Gollan MD or other providers.    Return in about 4 months (around 12/02/2024) for AWV, Fishel Wamble PCP and otherwise as needed. .   Total time spent:30 Minutes Time spent includes review of chart, medications, test results, and follow up plan with the patient.   [place] Controlled Substance Database was reviewed by me.  This patient was seen by Mardy Maxin, FNP-C in collaboration with Dr. Sigrid Bathe as a part of collaborative care agreement.   Dorethy Tomey R. Maxin, MSN, FNP-C Internal medicine

## 2024-09-07 ENCOUNTER — Ambulatory Visit: Admitting: Cardiovascular Disease

## 2024-12-02 ENCOUNTER — Ambulatory Visit: Admitting: Nurse Practitioner

## 2025-01-26 ENCOUNTER — Ambulatory Visit: Admitting: Dermatology
